# Patient Record
Sex: Female | Born: 2000 | Race: White | Hispanic: No | Smoking: Never smoker
Health system: Southern US, Community
[De-identification: ages and names within clinical notes are randomized; demographics above are authoritative.]

## PROBLEM LIST (undated history)

## (undated) DIAGNOSIS — R1110 Vomiting, unspecified: Secondary | ICD-10-CM

## (undated) DIAGNOSIS — R109 Unspecified abdominal pain: Secondary | ICD-10-CM

## (undated) DIAGNOSIS — F419 Anxiety disorder, unspecified: Secondary | ICD-10-CM

## (undated) DIAGNOSIS — Z789 Other specified health status: Secondary | ICD-10-CM

## (undated) DIAGNOSIS — R197 Diarrhea, unspecified: Secondary | ICD-10-CM

## (undated) HISTORY — DX: Anxiety disorder, unspecified: F41.9

## (undated) HISTORY — DX: Unspecified abdominal pain: R10.9

## (undated) HISTORY — DX: Diarrhea, unspecified: R19.7

## (undated) HISTORY — DX: Vomiting, unspecified: R11.10

## (undated) HISTORY — PX: NO PAST SURGERIES: SHX2092

---
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
## 2018-01-29 DIAGNOSIS — E282 Polycystic ovarian syndrome: Secondary | ICD-10-CM | POA: Insufficient documentation

## 2019-10-15 ENCOUNTER — Emergency Department: Payer: Medicaid Other

## 2019-10-15 ENCOUNTER — Other Ambulatory Visit: Payer: Self-pay

## 2019-10-15 ENCOUNTER — Emergency Department
Admission: EM | Admit: 2019-10-15 | Discharge: 2019-10-15 | Disposition: A | Discharge status: Home / Self Care | Payer: Medicaid Other | Attending: Emergency Medicine | Admitting: Emergency Medicine

## 2019-10-15 DIAGNOSIS — O219 Vomiting of pregnancy, unspecified: Secondary | ICD-10-CM | POA: Insufficient documentation

## 2019-10-15 DIAGNOSIS — Z3A01 Less than 8 weeks gestation of pregnancy: Secondary | ICD-10-CM | POA: Diagnosis not present

## 2019-10-15 DIAGNOSIS — R1013 Epigastric pain: Secondary | ICD-10-CM | POA: Diagnosis not present

## 2019-10-15 DIAGNOSIS — R112 Nausea with vomiting, unspecified: Secondary | ICD-10-CM

## 2019-10-15 DIAGNOSIS — Z79899 Other long term (current) drug therapy: Secondary | ICD-10-CM | POA: Diagnosis not present

## 2019-10-15 DIAGNOSIS — O26899 Other specified pregnancy related conditions, unspecified trimester: Secondary | ICD-10-CM

## 2019-10-15 LAB — COMPREHENSIVE METABOLIC PANEL
ALT: 17 U/L (ref 0–44)
AST: 21 U/L (ref 15–41)
Albumin: 5.2 g/dL — ABNORMAL HIGH (ref 3.5–5.0)
Alkaline Phosphatase: 49 U/L (ref 38–126)
Anion gap: 18 — ABNORMAL HIGH (ref 5–15)
BUN: 14 mg/dL (ref 6–20)
CO2: 18 mmol/L — ABNORMAL LOW (ref 22–32)
Calcium: 10 mg/dL (ref 8.9–10.3)
Chloride: 95 mmol/L — ABNORMAL LOW (ref 98–111)
Creatinine, Ser: 0.77 mg/dL (ref 0.44–1.00)
GFR calc Af Amer: >60 mL/min (ref >60)
GFR calc non Af Amer: >60 mL/min (ref >60)
Glucose, Bld: 171 mg/dL — ABNORMAL HIGH (ref 70–99)
Potassium: 3.4 mmol/L — ABNORMAL LOW (ref 3.5–5.1)
Sodium: 131 mmol/L — ABNORMAL LOW (ref 135–145)
Total Bilirubin: 1.5 mg/dL — ABNORMAL HIGH (ref 0.3–1.2)
Total Protein: 8.6 g/dL — ABNORMAL HIGH (ref 6.5–8.1)

## 2019-10-15 LAB — URINALYSIS, COMPLETE (UACMP) WITH MICROSCOPIC
Bilirubin Urine: NEGATIVE
Glucose, UA: 50 mg/dL — AB
Hgb urine dipstick: NEGATIVE
Ketones, ur: 80 mg/dL — AB
Leukocytes,Ua: NEGATIVE
Nitrite: NEGATIVE
Protein, ur: 100 mg/dL — AB
Specific Gravity, Urine: 1.029 (ref 1.005–1.030)
pH: 6 (ref 5.0–8.0)

## 2019-10-15 LAB — CBC
HCT: 41.1 % (ref 36.0–46.0)
Hemoglobin: 14.9 g/dL (ref 12.0–15.0)
MCH: 30.8 pg (ref 26.0–34.0)
MCHC: 36.3 g/dL — ABNORMAL HIGH (ref 30.0–36.0)
MCV: 85.1 fL (ref 80.0–100.0)
Platelets: 290 10*3/uL (ref 150–400)
RBC: 4.83 MIL/uL (ref 3.87–5.11)
RDW: 12.1 % (ref 11.5–15.5)
WBC: 16.5 10*3/uL — ABNORMAL HIGH (ref 4.0–10.5)
nRBC: 0 % (ref 0.0–0.2)

## 2019-10-15 LAB — HCG, QUANTITATIVE, PREGNANCY: hCG, Beta Chain, Quant, S: 65488 m[IU]/mL — ABNORMAL HIGH (ref <5)

## 2019-10-15 LAB — LIPASE, BLOOD: Lipase: 18 U/L (ref 11–51)

## 2019-10-15 LAB — POCT PREGNANCY, URINE: Preg Test, Ur: POSITIVE — AB

## 2019-10-15 MED ORDER — SODIUM CHLORIDE 0.9 % IV BOLUS
1000.0000 mL | Freq: Once | INTRAVENOUS | Status: AC
  Administered 2019-10-15: 1000 mL via INTRAVENOUS

## 2019-10-15 MED ORDER — SODIUM CHLORIDE 0.9 % IV BOLUS
1000.0000 mL | Freq: Once | INTRAVENOUS | Status: AC
  Administered 2019-10-15: 13:00:00 1000 mL via INTRAVENOUS

## 2019-10-15 MED ORDER — PROMETHAZINE HCL 12.5 MG PO TABS: 12.5000 mg | ORAL_TABLET | Freq: Four times a day (QID) | ORAL | 0 refills | Status: DC | PRN

## 2019-10-15 MED ORDER — PROMETHAZINE HCL 25 MG/ML IJ SOLN
12.5000 mg | Freq: Once | INTRAMUSCULAR | Status: AC
  Administered 2019-10-15: 14:00:00 12.5 mg via INTRAVENOUS
  Filled 2019-10-15: qty 1

## 2019-10-15 NOTE — ED Notes (Signed)
Lab called again to add on urinalysis.

## 2019-10-15 NOTE — ED Triage Notes (Signed)
Reports emesis when eating or drinking since Sunday. Pt found out she was pregnant Sunday. Pt alert and oriented X4, cooperative, RR even and unlabored, color WNL. Pt in NAD.

## 2019-10-15 NOTE — ED Notes (Signed)
No nausea of emesis currently.

## 2019-10-15 NOTE — ED Provider Notes (Signed)
[facility] Emergency Department Provider Note   ____________________________________________   First MD Initiated Contact with Patient 10/15/19 1344     (approximate)  I have reviewed the triage vital signs and the nursing notes.   HISTORY  Chief Complaint Emesis During Pregnancy    HPI Haley Robles is a 18 y.o. female who presents to the ED complaining of nausea and vomiting.  Patient reports she developed persistent nausea and vomiting about 5 days ago and has been unable to keep down any liquids or solids since then.  Emesis is nonbilious and nonbloody, is associated with nonbloody diarrhea as well.  She denies any fevers, cough, chest pain, or shortness of breath, but does complain of some upper abdominal pain.  She has not had any urinary symptoms and denies any vaginal bleeding or discharge.  She took a pregnancy test 4 days ago that was positive and states her last menstrual period was on September 9.  She has not yet established care with OB/GYN.        Past Medical History:  Diagnosis Date  . Abdominal pain, recurrent   . Diarrhea   . Vomiting     Patient Active Problem List   Diagnosis Date Noted  . Vomiting   . Diarrhea   . Generalized abdominal pain     History reviewed. No pertinent surgical history.  Prior to Admission medications   Medication Sig Start Date End Date Taking? Authorizing Provider  lansoprazole (PREVACID SOLUTAB) 15 MG disintegrating tablet Take 15 mg by mouth daily.      [provider]  promethazine (PHENERGAN) 12.5 MG tablet Take 1 tablet (12.5 mg total) by mouth every 6 (six) hours as needed for nausea or vomiting. 10/15/19   Blake Divine, MD    Allergies Patient has no known allergies.  Family History  Problem Relation Age of Onset  . Pancreatitis Father   . Cholelithiasis Father   . Ulcers Father   . Ulcers Maternal Grandmother   . Cholelithiasis Maternal Grandfather   . Crohn's disease  Cousin     Social History Social History   Tobacco Use  . Smoking status: Never Smoker  . Smokeless tobacco: Never Used  Substance Use Topics  . Alcohol use: Not on file  . Drug use: Not on file    Review of Systems  Constitutional: No fever/chills Eyes: No visual changes. ENT: No sore throat. Cardiovascular: Denies chest pain. Respiratory: Denies shortness of breath. Gastrointestinal: Positive for abdominal pain, nausea, vomiting, and diarrhea.  No constipation. Genitourinary: Negative for dysuria. Musculoskeletal: Negative for back pain. Skin: Negative for rash. Neurological: Negative for headaches, focal weakness or numbness.  ____________________________________________   PHYSICAL EXAM:  VITAL SIGNS: ED Triage Vitals [10/15/19 1224]  Enc Vitals Group     BP 113/61     Pulse Rate (!) 138     Resp 16     Temp 98.7 F (37.1 C)     Temp Source Oral     SpO2 100 %     Weight 118 lb (53.5 kg)     Height 5\' 4"  (1.626 m)     Head Circumference      Peak Flow      Pain Score 3     Pain Loc      Pain Edu?      Excl. in [place]?     Constitutional: Alert and oriented. Eyes: Conjunctivae are normal. Head: Atraumatic. Nose: No congestion/rhinnorhea. Mouth/Throat: Mucous membranes are  moist. Neck: Normal ROM Cardiovascular: Normal rate, regular rhythm. Grossly normal heart sounds. Respiratory: Normal respiratory effort.  No retractions. Lungs CTAB. Gastrointestinal: Soft and tender to palpation in epigastrium and right upper quadrant with no rebound or guarding. No distention. Genitourinary: deferred Musculoskeletal: No lower extremity tenderness nor edema. Neurologic:  Normal speech and language. No gross focal neurologic deficits are appreciated. Skin:  Skin is warm, dry and intact. No rash noted. Psychiatric: Mood and affect are normal. Speech and behavior are normal.  ____________________________________________   LABS (all labs ordered are listed, but only  abnormal results are displayed)  Labs Reviewed  COMPREHENSIVE METABOLIC PANEL - Abnormal; Notable for the following components:      Result Value   Sodium 131 (*)    Potassium 3.4 (*)    Chloride 95 (*)    CO2 18 (*)    Glucose, Bld 171 (*)    Total Protein 8.6 (*)    Albumin 5.2 (*)    Total Bilirubin 1.5 (*)    Anion gap 18 (*)    All other components within normal limits  CBC - Abnormal; Notable for the following components:   WBC 16.5 (*)    MCHC 36.3 (*)    All other components within normal limits  URINALYSIS, COMPLETE (UACMP) WITH MICROSCOPIC - Abnormal; Notable for the following components:   Color, Urine YELLOW (*)    APPearance CLOUDY (*)    Glucose, UA 50 (*)    Ketones, ur 80 (*)    Protein, ur 100 (*)    Bacteria, UA RARE (*)    All other components within normal limits  HCG, QUANTITATIVE, PREGNANCY - Abnormal; Notable for the following components:   hCG, Beta Chain, Quant, S [id number] (*)    All other components within normal limits  POCT PREGNANCY, URINE - Abnormal; Notable for the following components:   Preg Test, Ur POSITIVE (*)    All other components within normal limits  LIPASE, BLOOD  POC URINE PREG, ED     PROCEDURES  Procedure(s) performed (including Critical Care):  Procedures   ____________________________________________   INITIAL IMPRESSION / ASSESSMENT AND PLAN / ED COURSE       18 year old female with recent positive pregnancy test presents to the ED with persistent nausea and vomiting, has now developed some abdominal pain with epigastric tenderness.  Labs are significant for mild hyponatremia and hypokalemia, will give fluid bolus and treat nausea with Phenergan.  Given her abdominal pain, will screen ultrasound to assess for ectopic pregnancy as well as biliary pathology.  She has a very mildly elevated bilirubin but lipase is within normal limits and remainder of LFTs are unremarkable.  Right upper quadrant ultrasound is negative,  no evidence of gallstones or other biliary pathology.  Pelvic ultrasound shows intrauterine pregnancy at approximately 6 weeks and 2 days.  UA with no evidence of infection.  Patient feeling much better following dose of Phenergan, has been able to tolerate water here without any vomiting.  Will prescribe Phenergan for home use, counseled patient to follow-up with her OB/GYN and return to the ED for new worsening symptoms.  Patient agrees with plan.      ____________________________________________   FINAL CLINICAL IMPRESSION(S) / ED DIAGNOSES  Final diagnoses:  Nausea and vomiting  Vomiting during pregnancy     ED Discharge Orders         Ordered    promethazine (PHENERGAN) 12.5 MG tablet  Every 6 hours PRN     10/15/19  1731           Note:  This document was prepared using Dragon voice recognition software and may include unintentional dictation errors.   Chesley Noon, MD 10/15/19 2135

## 2019-10-15 NOTE — ED Notes (Signed)
Lab called to add on urinalysis. 

## 2019-10-20 ENCOUNTER — Ambulatory Visit (INDEPENDENT_AMBULATORY_CARE_PROVIDER_SITE_OTHER): Payer: Medicaid Other | Admitting: Certified Nurse Midwife

## 2019-10-20 ENCOUNTER — Encounter: Payer: Self-pay | Admitting: Certified Nurse Midwife

## 2019-10-20 ENCOUNTER — Other Ambulatory Visit: Payer: Self-pay

## 2019-10-20 VITALS — BP 106/72 | HR 99 | Ht 64.0 in | Wt 103.6 lb

## 2019-10-20 DIAGNOSIS — Z3201 Encounter for pregnancy test, result positive: Secondary | ICD-10-CM

## 2019-10-20 DIAGNOSIS — N926 Irregular menstruation, unspecified: Secondary | ICD-10-CM | POA: Diagnosis not present

## 2019-10-20 LAB — POCT URINE PREGNANCY: Preg Test, Ur: POSITIVE — AB

## 2019-10-20 MED ORDER — BONJESTA 20-20 MG PO TBCR: 1.0000 | EXTENDED_RELEASE_TABLET | Freq: Two times a day (BID) | ORAL | 4 refills | Status: DC

## 2019-10-20 NOTE — Progress Notes (Signed)
Subjective:    Haley Robles is a 18 y.o. female who presents for evaluation of amenorrhea. She believes she could be pregnant. Pregnancy is desired. Sexual Activity: single partner, contraception: none. Current symptoms also include: nausea. Last period was normal.   Patient's last menstrual period was 08/26/2019 (exact date). The following portions of the patient's history were reviewed and updated as appropriate: allergies, current medications, past family history, past medical history, past social history, past surgical history and problem list.  Review of Systems Pertinent items are noted in HPI.     Objective:    BP 106/72   Pulse 99   Ht 5\' 4"  (1.626 m)   Wt 103 lb 9 oz (47 kg)   LMP 08/26/2019 (Exact Date)   BMI 17.78 kg/m  General: alert, cooperative, appears stated age, no distress and no acute distress    Lab Review Urine HCG: positive    Assessment:    Absence of menstruation.     Plan:   Positive: EDC: 06/01/20 Briefly discussed pre-natal care options. MD or Midwifery care discussed.  Encouraged well-balanced diet, plenty of rest when needed, pre-natal vitamins daily and walking for exercise. Discussed self-help for nausea, avoiding OTC medications until consulting provider or pharmacist, other than Tylenol as needed, minimal caffeine (1-2 cups daily) and avoiding alcohol. She will schedule her dating u/s 1 wk, her nurse visit @ 12 wks and her initial NOB physical @ 12 wks.  Feel free to call with any questions.  Philip Aspen, CNM

## 2019-10-20 NOTE — Patient Instructions (Signed)

## 2019-10-27 ENCOUNTER — Other Ambulatory Visit: Payer: Self-pay

## 2019-10-27 ENCOUNTER — Ambulatory Visit (INDEPENDENT_AMBULATORY_CARE_PROVIDER_SITE_OTHER): Payer: Medicaid Other

## 2019-10-27 DIAGNOSIS — Z3A08 8 weeks gestation of pregnancy: Secondary | ICD-10-CM

## 2019-10-27 DIAGNOSIS — N926 Irregular menstruation, unspecified: Secondary | ICD-10-CM

## 2019-10-27 DIAGNOSIS — Z3687 Encounter for antenatal screening for uncertain dates: Secondary | ICD-10-CM

## 2019-11-06 ENCOUNTER — Ambulatory Visit (INDEPENDENT_AMBULATORY_CARE_PROVIDER_SITE_OTHER): Payer: Self-pay | Admitting: Certified Nurse Midwife

## 2019-11-06 ENCOUNTER — Other Ambulatory Visit: Payer: Self-pay

## 2019-11-06 VITALS — BP 106/72 | HR 99 | Ht 64.0 in | Wt 104.9 lb

## 2019-11-06 DIAGNOSIS — Z3491 Encounter for supervision of normal pregnancy, unspecified, first trimester: Secondary | ICD-10-CM

## 2019-11-06 NOTE — Progress Notes (Signed)
Haley Robles presents for NOB nurse interview visit. Pregnancy confirmation done _11/02/2019. G-1 P0. Pregnancy education material explained and given. 0 cats in home. NOB labs ordered.  HIV labs and drug screen were explained and ordered. PNV encouraged. Genetic screening options discussed. Genetic testing: Ordered/Declined/Unsure. Patient may discuss with the provider. Patient to follow up with provider in 11/24/2019 weeks for NOB physical. All questions answered. Ready set baby has been gone over. FMLA paper work Visual merchandiser.

## 2019-11-07 ENCOUNTER — Other Ambulatory Visit: Payer: Self-pay | Admitting: Certified Nurse Midwife

## 2019-11-07 LAB — URINALYSIS, ROUTINE W REFLEX MICROSCOPIC
Bilirubin, UA: NEGATIVE
Glucose, UA: NEGATIVE
Nitrite, UA: POSITIVE — AB
RBC, UA: NEGATIVE
Specific Gravity, UA: 1.025 (ref 1.005–1.030)
Urobilinogen, Ur: 0.2 mg/dL (ref 0.2–1.0)
pH, UA: 7 (ref 5.0–7.5)

## 2019-11-07 LAB — MICROSCOPIC EXAMINATION: Casts: NONE SEEN /lpf

## 2019-11-07 MED ORDER — NITROFURANTOIN MONOHYD MACRO 100 MG PO CAPS: 100.0000 mg | ORAL_CAPSULE | Freq: Two times a day (BID) | ORAL | 0 refills | Status: AC

## 2019-11-07 NOTE — Progress Notes (Signed)
Urine dip positive for nitrates. Orders placed for treatment.  Philip Aspen, CNM

## 2019-11-09 LAB — VARICELLA ZOSTER ANTIBODY, IGG: Varicella zoster IgG: <135 index — ABNORMAL LOW (ref >165)

## 2019-11-09 LAB — RUBELLA SCREEN: Rubella Antibodies, IGG: 1.82 index (ref >0.99)

## 2019-11-09 LAB — URINE CULTURE, OB REFLEX

## 2019-11-09 LAB — ANTIBODY SCREEN: Antibody Screen: NEGATIVE

## 2019-11-09 LAB — RPR: RPR Ser Ql: NONREACTIVE

## 2019-11-09 LAB — HIV ANTIBODY (ROUTINE TESTING W REFLEX): HIV Screen 4th Generation wRfx: NONREACTIVE

## 2019-11-09 LAB — ABO AND RH: Rh Factor: POSITIVE

## 2019-11-09 LAB — HEPATITIS B SURFACE ANTIGEN: Hepatitis B Surface Ag: NEGATIVE

## 2019-11-09 LAB — CULTURE, OB URINE

## 2019-11-10 LAB — MONITOR DRUG PROFILE 14(MW)
Amphetamine Scrn, Ur: NEGATIVE ng/mL
BARBITURATE SCREEN URINE: NEGATIVE ng/mL
BENZODIAZEPINE SCREEN, URINE: NEGATIVE ng/mL
Buprenorphine, Urine: NEGATIVE ng/mL
Cocaine (Metab) Scrn, Ur: NEGATIVE ng/mL
Creatinine(Crt), U: 168.6 mg/dL (ref 20.0–300.0)
Fentanyl, Urine: NEGATIVE pg/mL
Meperidine Screen, Urine: NEGATIVE ng/mL
Methadone Screen, Urine: NEGATIVE ng/mL
OXYCODONE+OXYMORPHONE UR QL SCN: NEGATIVE ng/mL
Opiate Scrn, Ur: NEGATIVE ng/mL
Ph of Urine: 7.4 (ref 4.5–8.9)
Phencyclidine Qn, Ur: NEGATIVE ng/mL
Propoxyphene Scrn, Ur: NEGATIVE ng/mL
SPECIFIC GRAVITY: 1.029
Tramadol Screen, Urine: NEGATIVE ng/mL

## 2019-11-10 LAB — NICOTINE SCREEN, URINE: Cotinine Ql Scrn, Ur: NEGATIVE ng/mL

## 2019-11-10 LAB — CANNABINOID (GC/MS), URINE
Cannabinoid: POSITIVE — AB
Carboxy THC (GC/MS): 204 ng/mL

## 2019-11-11 LAB — GC/CHLAMYDIA PROBE AMP
Chlamydia trachomatis, NAA: NEGATIVE
Neisseria Gonorrhoeae by PCR: NEGATIVE

## 2019-11-24 ENCOUNTER — Encounter: Payer: Self-pay | Admitting: Certified Nurse Midwife

## 2019-11-24 ENCOUNTER — Ambulatory Visit (INDEPENDENT_AMBULATORY_CARE_PROVIDER_SITE_OTHER): Payer: Medicaid Other | Admitting: Certified Nurse Midwife

## 2019-11-24 ENCOUNTER — Other Ambulatory Visit: Payer: Self-pay

## 2019-11-24 VITALS — BP 97/66 | HR 118 | Wt 105.3 lb

## 2019-11-24 DIAGNOSIS — Z3491 Encounter for supervision of normal pregnancy, unspecified, first trimester: Secondary | ICD-10-CM

## 2019-11-24 DIAGNOSIS — R636 Underweight: Secondary | ICD-10-CM

## 2019-11-24 DIAGNOSIS — N390 Urinary tract infection, site not specified: Secondary | ICD-10-CM

## 2019-11-24 DIAGNOSIS — O99711 Diseases of the skin and subcutaneous tissue complicating pregnancy, first trimester: Secondary | ICD-10-CM

## 2019-11-24 NOTE — Progress Notes (Signed)
NEW OB HISTORY AND PHYSICAL  SUBJECTIVE:       Haley Robles is a 18 y.o. G1P0 female, Patient's last menstrual period was 08/26/2019., Estimated Date of Delivery: 06/07/20, [redacted]w[redacted]d, presents today for establishment of Prenatal Care. She has no unusual complaints and complains of fatigue and feeling hot.   Body mass index is 18.08 kg/m.     Gynecologic History Patient's last menstrual period was 08/26/2019. Normal Contraception: none Last Pap: n/a   Obstetric History OB History  Gravida Para Term Preterm AB Living  1            SAB TAB Ectopic Multiple Live Births               # Outcome Date GA Lbr Len/2nd Weight Sex Delivery Anes PTL Lv  1 Current             Past Medical History:  Diagnosis Date  . Abdominal pain, recurrent   . Anxiety   . Diarrhea   . Vomiting     No past surgical history on file.  Current Outpatient Medications on File Prior to Visit  Medication Sig Dispense Refill  . Prenatal Vit-Fe Fumarate-FA (PRENATAL MULTIVITAMIN) TABS tablet Take 1 tablet by mouth daily at 12 noon.    . Doxylamine-Pyridoxine ER (BONJESTA) 20-20 MG TBCR Take 1 capsule by mouth 2 (two) times daily. (Patient not taking: Reported on 11/24/2019) 62 tablet 4  . lansoprazole (PREVACID SOLUTAB) 15 MG disintegrating tablet Take 15 mg by mouth daily.      . promethazine (PHENERGAN) 12.5 MG tablet Take 1 tablet (12.5 mg total) by mouth every 6 (six) hours as needed for nausea or vomiting. (Patient not taking: Reported on 11/24/2019) 15 tablet 0   No current facility-administered medications on file prior to visit.     No Known Allergies  Social History   Socioeconomic History  . Marital status: Single    Spouse name: Not on file  . Number of children: Not on file  . Years of education: Not on file  . Highest education level: Not on file  Occupational History  . Not on file  Social Needs  . Financial resource strain: Not on file  . Food insecurity    Worry: Not on file   Inability: Not on file  . Transportation needs    Medical: Not on file    Non-medical: Not on file  Tobacco Use  . Smoking status: Never Smoker  . Smokeless tobacco: Never Used  Substance and Sexual Activity  . Alcohol use: Not Currently  . Drug use: Not Currently  . Sexual activity: Yes    Birth control/protection: None  Lifestyle  . Physical activity    Days per week: Not on file    Minutes per session: Not on file  . Stress: Not on file  Relationships  . Social Musician on phone: Not on file    Gets together: Not on file    Attends religious service: Not on file    Active member of club or organization: Not on file    Attends meetings of clubs or organizations: Not on file    Relationship status: Not on file  . Intimate partner violence    Fear of current or ex partner: Not on file    Emotionally abused: Not on file    Physically abused: Not on file    Forced sexual activity: Not on file  Other Topics Concern  . Not  on file  Social History Narrative   6th grade    Family History  Problem Relation Age of Onset  . Pancreatitis Father   . Cholelithiasis Father   . Ulcers Father   . Ulcers Maternal Grandmother   . Cholelithiasis Maternal Grandfather   . Crohn's disease Cousin     The following portions of the patient's history were reviewed and updated as appropriate: allergies, current medications, past OB history, past medical history, past surgical history, past family history, past social history, and problem list.    OBJECTIVE: Initial Physical Exam (New OB)  GENERAL APPEARANCE: alert, well appearing, in no apparent distress, oriented to person, place and time, underweight HEAD: normocephalic, atraumatic MOUTH: mucous membranes moist, pharynx normal without lesions THYROID: no thyromegaly or masses present BREASTS: no masses noted, no significant tenderness, no palpable axillary nodes, no skin changes, bilateral nipple piercing's  LUNGS: clear  to auscultation, no wheezes, rales or rhonchi, symmetric air entry HEART: regular rate and rhythm, no murmurs ABDOMEN: soft, nontender, nondistended, no abnormal masses, no epigastric pain EXTREMITIES: no redness or tenderness in the calves or thighs, no edema, no limitation in range of motion, intact peripheral pulses SKIN: normal coloration and turgor, no rashes LYMPH NODES: no adenopathy palpable NEUROLOGIC: alert, oriented, normal speech, no focal findings or movement disorder noted  PELVIC EXAM EXTERNAL GENITALIA: normal appearing vulva with no masses, tenderness or lesions VAGINA: no abnormal discharge or lesions CERVIX: no lesions or cervical motion tenderness UTERUS: gravid ADNEXA: no masses palpable and nontender OB EXAM PELVIMETRY: appears adequate RECTUM: exam not indicated  ASSESSMENT: Normal pregnancy  PLAN: New OB counseling: The patient has been given an overview regarding routine prenatal care. Recommendations regarding diet, weight gain, and exercise in pregnancy were given. Prenatal testing, optional genetic testing,carrier screening testing, and ultrasound use in pregnancy were reviewed. Referral placed for nutritionist due to BMI.9underweight). Refferral placed to dermatology for acne in pregnancy. Benefits of Breast Feeding were discussed. The patient is encouraged to consider nursing her baby post partum. Follow up 4 wk.   Philip Aspen, CNM

## 2019-11-24 NOTE — Addendum Note (Signed)
Addended by: Raliegh Ip on: 11/24/2019 04:02 PM   Modules accepted: Orders

## 2019-11-24 NOTE — Patient Instructions (Signed)

## 2019-11-25 LAB — CBC
Hematocrit: 36.7 % (ref 34.0–46.6)
Hemoglobin: 12.7 g/dL (ref 11.1–15.9)
MCH: 31.2 pg (ref 26.6–33.0)
MCHC: 34.6 g/dL (ref 31.5–35.7)
MCV: 90 fL (ref 79–97)
Platelets: 248 10*3/uL (ref 150–450)
RBC: 4.07 x10E6/uL (ref 3.77–5.28)
RDW: 12.3 % (ref 11.7–15.4)
WBC: 11.3 10*3/uL — ABNORMAL HIGH (ref 3.4–10.8)

## 2019-11-26 LAB — URINE CULTURE

## 2019-12-07 NOTE — Telephone Encounter (Signed)
Pt called to find out the gender of baby. Told her the nurse was on lunch & I'd have you call once you return.

## 2019-12-16 ENCOUNTER — Other Ambulatory Visit: Payer: Self-pay

## 2019-12-16 ENCOUNTER — Encounter: Payer: Medicaid Other | Attending: Certified Nurse Midwife | Admitting: Skilled Nursing Facility1

## 2019-12-16 ENCOUNTER — Encounter: Payer: Self-pay | Admitting: Skilled Nursing Facility1

## 2019-12-16 DIAGNOSIS — O26899 Other specified pregnancy related conditions, unspecified trimester: Secondary | ICD-10-CM | POA: Insufficient documentation

## 2019-12-16 DIAGNOSIS — Z3A00 Weeks of gestation of pregnancy not specified: Secondary | ICD-10-CM | POA: Insufficient documentation

## 2019-12-16 DIAGNOSIS — R636 Underweight: Secondary | ICD-10-CM | POA: Insufficient documentation

## 2019-12-16 DIAGNOSIS — O2611 Low weight gain in pregnancy, first trimester: Secondary | ICD-10-CM

## 2019-12-16 DIAGNOSIS — O2612 Low weight gain in pregnancy, second trimester: Secondary | ICD-10-CM

## 2019-12-16 DIAGNOSIS — Z713 Dietary counseling and surveillance: Secondary | ICD-10-CM | POA: Diagnosis present

## 2019-12-16 NOTE — Progress Notes (Signed)
  Assessment:  Primary concerns today: underweight in preganancy.   Pt states she has always had trouble gaining weight her whole life. Pt states she was 105.5 pounds at her last doctor appt. Pt states once she got her [facility] card she went grocery shopping. Pt states this is her first pregnacy and she is about [redacted] weeks along. Pt states her weight flucuated a lot and states she was usually 110 pounds (down to 107). Pt states when she was first pregnant she has hyperemesis and diarrhea getting her down to 103 pounds. Pt states her mom told her she was about 110 pounds when she was pregnant and her aunt was thin as well. Pt states she is lazy and always has been stating she does not have much energy. Pt states most of her life she has been tired able to just fall asleep easily. Pt states she would typically eat once a day so being pregnant having to eat throughout the day has been challenging. Pt states she does not have an emotional reason for not eating just no desire. Pt states she has  sensitive stomach for foods getting sick easily. Pt states she missed a lot of days to school due to getting sick due to anxiety going to school. Pt states she has pretty bad anxiety. Pt states she does desire to gain weight. Pt states in the last few days she has had more enregy than she ever has in the past. Pt states she recently learned eating fast food is not healthy so she eats more from home. Pt sates last week in the morning she woke up vomiting and diarrhea.  Pt states she has a great support system with her mother and fiancee.   MEDICATIONS: none   DIETARY INTAKE:  24 hr recall:  Breakfast: Cereal or pancakes with sausage and eggs with fruit Fruit Naked drink Cheese and crackers and grapes chicken biscuit with cheese and honey  Fruit and some noodles   Beverages: water, milk (every other day), naked drinks   Usual physical activity: ADL's  Intervention:  Nutrition coinseling. Dietitian educated pt on  her anxiety and that connection to no appetite and little energy as well as that impact on pregnancy weight/fetus growth/develpoment.  Goals: Drink one glass of whole milk every day (7 days) Eat greek yogurt  When eating cereal try nuts with it or eggs on the side Aim to eat every 3 hours  On days you are feeling anxious do not go longer than 6 hours without eating  Change what category of food you are snacking on throughout the day: switch up between vegetables, fruits, and proteins  Aim to limit eating out to once a week Take your prenatal every day; try natures made prenatal multi Do not take your prenatal until after dinner  Teaching Method Utilized: Visual Auditory Hands on  Handouts given during visit include:  myplate  Barriers to learning/adherence to lifestyle change: none identified  Demonstrated degree of understanding via:  Teach Back   Monitoring/Evaluation:  Dietary intake, exercise, and weight

## 2019-12-18 NOTE — L&D Delivery Note (Signed)
Delivery Note  Called by RN for impending birth at 33, SVE: 10/100/+3.   0905 In room to see patient, fetal vertex visible on perineum.   Spontaneous vaginal birth of liveborn female infant in left occiput anterior position over intact perineum at 0909. Infant immediately to maternal abdomen. Delayed cord clamping, skin to skin, three (3) vessel cord and single tub of cord blood collected. APGARs: 9, 10. Receiving nurse and Neonatologist present at bedside for birth.   IM pitocin given. Large gush of blood noted. Spontaneous delivery of intact placenta at 0912. Asymmetric bilateral labial lacerations hemostatic, unrepaired. Uterus firm. Rubra small. Anesthesia none. Vault check completed. Counts correct x 2. QBL pending.   Initiate routine postpartum care and orders.  Mom to postpartum.  Baby to Couplet care / Skin to Skin.  FOB present at bedside and overjoyed with the birth of "Haley Robles".    Gunnar Bulla, CNM [facility], [facility] 05/12/2020, 10:04 AM

## 2019-12-28 ENCOUNTER — Encounter: Payer: Medicaid Other | Admitting: Certified Nurse Midwife

## 2020-01-04 ENCOUNTER — Other Ambulatory Visit: Payer: Self-pay

## 2020-01-04 ENCOUNTER — Ambulatory Visit (INDEPENDENT_AMBULATORY_CARE_PROVIDER_SITE_OTHER): Payer: Medicaid Other | Admitting: Certified Nurse Midwife

## 2020-01-04 VITALS — BP 104/59 | HR 97 | Wt 120.2 lb

## 2020-01-04 DIAGNOSIS — Z3402 Encounter for supervision of normal first pregnancy, second trimester: Secondary | ICD-10-CM

## 2020-01-04 LAB — POCT URINALYSIS DIPSTICK OB
Bilirubin, UA: NEGATIVE
Blood, UA: NEGATIVE
Glucose, UA: NEGATIVE
Ketones, UA: NEGATIVE
Leukocytes, UA: NEGATIVE
Nitrite, UA: NEGATIVE
Spec Grav, UA: 1.01 (ref 1.010–1.025)
Urobilinogen, UA: 0.2 E.U./dL
pH, UA: 7.5 (ref 5.0–8.0)

## 2020-01-04 NOTE — Patient Instructions (Signed)

## 2020-01-04 NOTE — Progress Notes (Signed)
ROB doing well. Feels fluttering. Pt saw dietician - gained weight this visit.  Discussed round ligament pain , and self help measures. Reassurance given. Reviewed anatomy scan next visit. Follow up 3 wks.   Doreene Burke, CNM

## 2020-01-26 ENCOUNTER — Other Ambulatory Visit: Payer: Self-pay

## 2020-01-26 ENCOUNTER — Ambulatory Visit (INDEPENDENT_AMBULATORY_CARE_PROVIDER_SITE_OTHER): Payer: Medicaid Other | Admitting: Certified Nurse Midwife

## 2020-01-26 ENCOUNTER — Other Ambulatory Visit: Payer: Medicaid Other

## 2020-01-26 VITALS — BP 97/65 | HR 94 | Wt 122.3 lb

## 2020-01-26 DIAGNOSIS — Z3402 Encounter for supervision of normal first pregnancy, second trimester: Secondary | ICD-10-CM

## 2020-01-26 NOTE — Progress Notes (Signed)
ROB doing well. Feels occasional fluttering. U/s today canceled due to tech being out. Rescheduled for Thursday. Follow up  Thursday for u/s and ROB in 4 wks with Loletta Specter, CNM

## 2020-01-26 NOTE — Patient Instructions (Signed)

## 2020-01-27 NOTE — Lactation Note (Signed)
LATE ENTRY  Lactation Consultation Note  Patient Name: Haley Robles Date: 01/27/2020     Maternal Data    Feeding    LATCH Score                   Interventions    Lactation Tools Discussed/Used     Consult Status   On 01/26/20 Lactation student discussed benefits of breastfeeding per the Ready, Set, Baby curriculum. Hardie Pulley encouraged to review breastfeeding information on Ready, set, [employer] site and given information for virtual breastfeeding classes.     Arlyss Gandy 01/27/2020, 4:51 PM

## 2020-01-28 ENCOUNTER — Other Ambulatory Visit: Payer: Self-pay

## 2020-01-28 ENCOUNTER — Ambulatory Visit (INDEPENDENT_AMBULATORY_CARE_PROVIDER_SITE_OTHER): Payer: Medicaid Other

## 2020-01-28 DIAGNOSIS — Z3402 Encounter for supervision of normal first pregnancy, second trimester: Secondary | ICD-10-CM

## 2020-02-18 ENCOUNTER — Other Ambulatory Visit: Payer: Self-pay

## 2020-02-18 ENCOUNTER — Encounter: Payer: Self-pay | Admitting: Obstetrics and Gynecology

## 2020-02-18 ENCOUNTER — Observation Stay
Admission: EM | Admit: 2020-02-18 | Discharge: 2020-02-18 | Disposition: A | Discharge status: Home / Self Care | Payer: Medicaid Other | Attending: Certified Nurse Midwife | Admitting: Certified Nurse Midwife

## 2020-02-18 ENCOUNTER — Telehealth: Payer: Self-pay

## 2020-02-18 ENCOUNTER — Telehealth: Payer: Self-pay | Admitting: Certified Nurse Midwife

## 2020-02-18 ENCOUNTER — Observation Stay: Payer: Medicaid Other

## 2020-02-18 DIAGNOSIS — Z20822 Contact with and (suspected) exposure to covid-19: Secondary | ICD-10-CM | POA: Diagnosis not present

## 2020-02-18 DIAGNOSIS — O26899 Other specified pregnancy related conditions, unspecified trimester: Secondary | ICD-10-CM | POA: Diagnosis present

## 2020-02-18 DIAGNOSIS — Z3A24 24 weeks gestation of pregnancy: Secondary | ICD-10-CM | POA: Insufficient documentation

## 2020-02-18 DIAGNOSIS — O99891 Other specified diseases and conditions complicating pregnancy: Secondary | ICD-10-CM | POA: Diagnosis not present

## 2020-02-18 DIAGNOSIS — R1031 Right lower quadrant pain: Secondary | ICD-10-CM

## 2020-02-18 DIAGNOSIS — O2342 Unspecified infection of urinary tract in pregnancy, second trimester: Principal | ICD-10-CM | POA: Insufficient documentation

## 2020-02-18 DIAGNOSIS — R1114 Bilious vomiting: Secondary | ICD-10-CM | POA: Diagnosis not present

## 2020-02-18 DIAGNOSIS — R112 Nausea with vomiting, unspecified: Secondary | ICD-10-CM | POA: Diagnosis present

## 2020-02-18 LAB — URINALYSIS, COMPLETE (UACMP) WITH MICROSCOPIC
Bilirubin Urine: NEGATIVE
Glucose, UA: NEGATIVE mg/dL
Ketones, ur: 20 mg/dL — AB
Nitrite: NEGATIVE
Protein, ur: 100 mg/dL — AB
RBC / HPF: >50 RBC/hpf — ABNORMAL HIGH (ref 0–5)
Specific Gravity, Urine: 1.014 (ref 1.005–1.030)
WBC, UA: >50 WBC/hpf — ABNORMAL HIGH (ref 0–5)
pH: 6 (ref 5.0–8.0)

## 2020-02-18 LAB — RESPIRATORY PANEL BY RT PCR (FLU A&B, COVID)
Influenza A by PCR: NEGATIVE
Influenza B by PCR: NEGATIVE
SARS Coronavirus 2 by RT PCR: NEGATIVE

## 2020-02-18 MED ORDER — LOPERAMIDE HCL 2 MG PO CAPS: 4.0000 mg | ORAL_CAPSULE | ORAL | 0 refills | Status: DC | PRN

## 2020-02-18 MED ORDER — MORPHINE SULFATE (PF) 4 MG/ML IV SOLN
INTRAVENOUS | Status: AC
  Administered 2020-02-18: 12:00:00 4 mg via INTRAVENOUS
  Filled 2020-02-18: qty 1

## 2020-02-18 MED ORDER — NITROFURANTOIN MONOHYD MACRO 100 MG PO CAPS: 100.0000 mg | ORAL_CAPSULE | Freq: Two times a day (BID) | ORAL | Status: DC

## 2020-02-18 MED ORDER — NITROFURANTOIN MONOHYD MACRO 100 MG PO CAPS: 100.0000 mg | ORAL_CAPSULE | Freq: Two times a day (BID) | ORAL | 0 refills | Status: DC

## 2020-02-18 MED ORDER — ONDANSETRON 4 MG PO TBDP: 4.0000 mg | ORAL_TABLET | Freq: Four times a day (QID) | ORAL | 0 refills | Status: DC | PRN

## 2020-02-18 MED ORDER — LACTATED RINGERS IV BOLUS
1000.0000 mL | Freq: Once | INTRAVENOUS | Status: AC
  Administered 2020-02-18: 1000 mL via INTRAVENOUS

## 2020-02-18 MED ORDER — DEXTROSE IN LACTATED RINGERS 5 % IV SOLN: INTRAVENOUS | Status: DC

## 2020-02-18 MED ORDER — MORPHINE SULFATE (PF) 4 MG/ML IV SOLN: 4.0000 mg | INTRAVENOUS | Status: DC | PRN

## 2020-02-18 MED ORDER — ONDANSETRON HCL 4 MG/2ML IJ SOLN: 4.0000 mg | Freq: Four times a day (QID) | INTRAMUSCULAR | Status: DC | PRN

## 2020-02-18 MED ORDER — ONDANSETRON HCL 4 MG/2ML IJ SOLN
INTRAMUSCULAR | Status: AC
  Administered 2020-02-18: 4 mg via INTRAVENOUS
  Filled 2020-02-18: qty 2

## 2020-02-18 MED ORDER — NITROFURANTOIN MONOHYD MACRO 100 MG PO CAPS
ORAL_CAPSULE | ORAL | Status: AC
  Administered 2020-02-18: 100 mg
  Filled 2020-02-18: qty 1

## 2020-02-18 MED ORDER — LOPERAMIDE HCL 2 MG PO CAPS
4.0000 mg | ORAL_CAPSULE | ORAL | Status: DC | PRN
  Filled 2020-02-18: qty 2

## 2020-02-18 NOTE — Discharge Instructions (Signed)
Abdominal Pain During Pregnancy  Belly (abdominal) pain is common during pregnancy. There are many possible causes. Most of the time, it is not a serious problem. Other times, it can be a sign that something is wrong with the pregnancy. Always tell your doctor if you have belly pain. Follow these instructions at home:  Do not have sex or put anything in your vagina until your pain goes away completely.  Get plenty of rest until your pain gets better.  Drink enough fluid to keep your pee (urine) pale yellow.  Take over-the-counter and prescription medicines only as told by your doctor.  Keep all follow-up visits as told by your doctor. This is important. Contact a doctor if:  Your pain continues or gets worse after resting.  You have lower belly pain that: ? Comes and goes at regular times. ? Spreads to your back. ? Feels like menstrual cramps.  You have pain or burning when you pee (urinate). Get help right away if:  You have a fever or chills.  You have vaginal bleeding.  You are leaking fluid from your vagina.  You are passing tissue from your vagina.  You throw up (vomit) for more than 24 hours.  You have watery poop (diarrhea) for more than 24 hours.  Your baby is moving less than usual.  You feel very weak or faint.  You have shortness of breath.  You have very bad pain in your upper belly. Summary  Belly (abdominal) pain is common during pregnancy. There are many possible causes.  If you have belly pain during pregnancy, tell your doctor right away.  Keep all follow-up visits as told by your doctor. This is important. This information is not intended to replace advice given to you by your health care provider. Make sure you discuss any questions you have with your health care provider. Document Revised: 03/23/2019 Document Reviewed: 03/07/2017 Elsevier Patient Education  2020 Elsevier Inc.   Round Ligament Pain  The round ligament is a cord of muscle  and tissue that helps support the uterus. It can become a source of pain during pregnancy if it becomes stretched or twisted as the baby grows. The pain usually begins in the second trimester (13-28 weeks) of pregnancy, and it can come and go until the baby is delivered. It is not a serious problem, and it does not cause harm to the baby. Round ligament pain is usually a short, sharp, and pinching pain, but it can also be a dull, lingering, and aching pain. The pain is felt in the lower side of the abdomen or in the groin. It usually starts deep in the groin and moves up to the outside of the hip area. The pain may occur when you:  Suddenly change position, such as quickly going from a sitting to standing position.  Roll over in bed.  Cough or sneeze.  Do physical activity. Follow these instructions at home:   Watch your condition for any changes.  When the pain starts, relax. Then try any of these methods to help with the pain: ? Sitting down. ? Flexing your knees up to your abdomen. ? Lying on your side with one pillow under your abdomen and another pillow between your legs. ? Sitting in a warm bath for 15-20 minutes or until the pain goes away.  Take over-the-counter and prescription medicines only as told by your health care provider.  Move slowly when you sit down or stand up.  Avoid long walks if   they cause pain.  Stop or reduce your physical activities if they cause pain.  Keep all follow-up visits as told by your health care provider. This is important. Contact a health care provider if:  Your pain does not go away with treatment.  You feel pain in your back that you did not have before.  Your medicine is not helping. Get help right away if:  You have a fever or chills.  You develop uterine contractions.  You have vaginal bleeding.  You have nausea or vomiting.  You have diarrhea.  You have pain when you urinate. Summary  Round ligament pain is felt in the  lower abdomen or groin. It is usually a short, sharp, and pinching pain. It can also be a dull, lingering, and aching pain.  This pain usually begins in the second trimester (13-28 weeks). It occurs because the uterus is stretching with the growing baby, and it is not harmful to the baby.  You may notice the pain when you suddenly change position, when you cough or sneeze, or during physical activity.  Relaxing, flexing your knees to your abdomen, lying on one side, or taking a warm bath may help to get rid of the pain.  Get help from your health care provider if the pain does not go away or if you have vaginal bleeding, nausea, vomiting, diarrhea, or painful urination. This information is not intended to replace advice given to you by your health care provider. Make sure you discuss any questions you have with your health care provider. Document Revised: 05/21/2018 Document Reviewed: 05/21/2018 Elsevier Patient Education  2020 Elsevier Inc.  

## 2020-02-18 NOTE — Telephone Encounter (Signed)
Patient called saying she's having really bad pain in her right side below her rib cage causing her to throw up.   -TC

## 2020-02-18 NOTE — Progress Notes (Signed)
Pt transported to US

## 2020-02-18 NOTE — Progress Notes (Signed)
CNM updated and discharge orders given. Pt to discharge home with nausea medication and instructions on when to follow up with her provider.

## 2020-02-18 NOTE — Progress Notes (Signed)
IV started per CNM orders and medications given to patient. Pt is feeling better after IV Zofran and had no emesis since the IV medication and pain is down from 8 to 5 on pain scale following pain medication. RN awaiting results from lab then will notify CNM. Pt resting at this time while waiting for [place].

## 2020-02-18 NOTE — OB Triage Note (Signed)
Discharge instructions reviewed with the patient. Pt verbalized understanding of discharge instructions. Pt instructed to advance diet slowly until tolerating regular diet. Prescriptions reviewed with the pt and pt has no further questions at this time. Pt discharged home in stable condition with significant other.

## 2020-02-18 NOTE — Progress Notes (Signed)
CNM notified of lab and [place] results. Orders given for regular diet and Antibiotics. If pt tolerates regular diet then she can d/c home.

## 2020-02-18 NOTE — Progress Notes (Signed)
Pt ate a few bites of her food then became nauseated and started feeling bad again. Will notify CNM for instructions.

## 2020-02-18 NOTE — Telephone Encounter (Signed)
Attempted to call patient at number listed in message taken by Ardelle Lesches- no answer and mailbox is full. Mychart message sent to patient.

## 2020-02-18 NOTE — OB Triage Note (Signed)
Pt is a 18yo G1P0 at 106w2d that presents from the ED with c/o Right abdominal pain. Pt describes pain as "the worst period cramp I've ever had." Pt states the pain is constant and started around 8pm. Pt states the pain is a 8/10 pain scale and has caused nausea and vomiting this morning at least 10 times. Pt states the emesis is " Yellow, almost like a stomach bile." Pt denies VB, LOF and states decreased FM today. Monitors applied and initial FHt 145.

## 2020-02-20 ENCOUNTER — Other Ambulatory Visit: Payer: Self-pay

## 2020-02-20 ENCOUNTER — Encounter: Payer: Self-pay | Admitting: Obstetrics and Gynecology

## 2020-02-20 ENCOUNTER — Observation Stay
Admission: EM | Admit: 2020-02-20 | Discharge: 2020-02-20 | Disposition: A | Discharge status: Home / Self Care | Payer: Medicaid Other | Attending: Certified Nurse Midwife | Admitting: Certified Nurse Midwife

## 2020-02-20 DIAGNOSIS — O2342 Unspecified infection of urinary tract in pregnancy, second trimester: Secondary | ICD-10-CM | POA: Diagnosis not present

## 2020-02-20 DIAGNOSIS — O99891 Other specified diseases and conditions complicating pregnancy: Secondary | ICD-10-CM | POA: Diagnosis not present

## 2020-02-20 DIAGNOSIS — R1084 Generalized abdominal pain: Secondary | ICD-10-CM | POA: Diagnosis not present

## 2020-02-20 DIAGNOSIS — Z3A24 24 weeks gestation of pregnancy: Secondary | ICD-10-CM | POA: Diagnosis not present

## 2020-02-20 LAB — URINE CULTURE: Culture: >=100000 — AB

## 2020-02-20 LAB — URINALYSIS, ROUTINE W REFLEX MICROSCOPIC
Bilirubin Urine: NEGATIVE
Glucose, UA: NEGATIVE mg/dL
Ketones, ur: 20 mg/dL — AB
Nitrite: NEGATIVE
Protein, ur: NEGATIVE mg/dL
Specific Gravity, Urine: 1.004 — ABNORMAL LOW (ref 1.005–1.030)
pH: 6 (ref 5.0–8.0)

## 2020-02-20 MED ORDER — ACETAMINOPHEN 325 MG PO TABS: 650.0000 mg | ORAL_TABLET | ORAL | Status: DC | PRN

## 2020-02-20 MED ORDER — OXYCODONE-ACETAMINOPHEN 5-325 MG PO TABS: 1.0000 | ORAL_TABLET | Freq: Four times a day (QID) | ORAL | 0 refills | Status: DC | PRN

## 2020-02-20 MED ORDER — SODIUM CHLORIDE 0.9 % IV SOLN
1.0000 g | Freq: Once | INTRAVENOUS | Status: AC
  Administered 2020-02-20: 1 g via INTRAVENOUS
  Filled 2020-02-20: qty 1

## 2020-02-20 MED ORDER — SODIUM CHLORIDE 0.9 % IV SOLN
8.0000 mg | Freq: Once | INTRAVENOUS | Status: AC
  Administered 2020-02-20: 8 mg via INTRAVENOUS
  Filled 2020-02-20: qty 4

## 2020-02-20 MED ORDER — LACTATED RINGERS IV SOLN: INTRAVENOUS | Status: DC

## 2020-02-20 MED ORDER — OXYCODONE-ACETAMINOPHEN 5-325 MG PO TABS
1.0000 | ORAL_TABLET | Freq: Four times a day (QID) | ORAL | Status: DC | PRN
  Administered 2020-02-20 (×2): 1 via ORAL
  Filled 2020-02-20 (×2): qty 1

## 2020-02-20 NOTE — OB Triage Note (Signed)
Patient here for abdominal pain that has gotten much worse since she was seen a few days ago. She reports it now feels like a knife stabbing her in the side and is on both sides she is also having new pain under her breasts on both sides. Afebrile, she does report taking tylenol regularly and feeling hot but did not take her temp. She has continued to vomit since last here and is not able to keep much down. She has continued to take antibiotics.

## 2020-02-20 NOTE — OB Triage Note (Signed)
    L&D OB Triage Note  SUBJECTIVE Haley Robles is a 19 y.o. G1P0 female at [redacted]w[redacted]d, EDD Estimated Date of Delivery: 06/07/20 who presented to triage with complaints of nausea and bilateral side pain. She was seen on 02/18/20 and diagnosed with UTI. She denies contractions, loss of fluid , and vaginal bleeding.   OB History  Gravida Para Term Preterm AB Living  1 0 0 0 0 0  SAB TAB Ectopic Multiple Live Births  0 0 0 0 0    # Outcome Date GA Lbr Len/2nd Weight Sex Delivery Anes PTL Lv  1 Current             Medications Prior to Admission  Medication Sig Dispense Refill Last Dose  . loperamide (IMODIUM) 2 MG capsule Take 2 capsules (4 mg total) by mouth as needed for diarrhea or loose stools. 30 capsule 0 Past Week at Unknown time  . nitrofurantoin, macrocrystal-monohydrate, (MACROBID) 100 MG capsule Take 1 capsule (100 mg total) by mouth every 12 (twelve) hours. 13 capsule 0 02/20/2020 at Unknown time  . ondansetron (ZOFRAN ODT) 4 MG disintegrating tablet Take 1 tablet (4 mg total) by mouth every 6 (six) hours as needed for nausea. 20 tablet 0 Past Week at Unknown time  . Prenatal Vit-Fe Fumarate-FA (PRENATAL MULTIVITAMIN) TABS tablet Take 1 tablet by mouth daily at 12 noon.   Past Week at Unknown time     OBJECTIVE  Nursing Evaluation:   BP 116/62 (BP Location: Right Arm)   Pulse (!) 108   Temp 98.6 F (37 C) (Axillary)   Resp 16   Ht 5\' 4"  (1.626 m)   Wt 54.4 kg   LMP 08/26/2019   SpO2 99%   BMI 20.60 kg/m    Findings:       nausea and pain due to UTI in pregnancy       NST was performed and has been reviewed by me.  NST INTERPRETATION: Category I  Mode: Doppler Baseline Rate (A): 140 bpm(fht)  Contraction Frequency (min): None noted  ASSESSMENT Impression:  1.  Pregnancy:  G1P0 at [redacted]w[redacted]d , EDD Estimated Date of Delivery: 06/07/20 2.  Reassuring fetal and maternal status 3.  UTI  4.rocephin 1 g IV x 1  PLAN 1. Discussed current condition and above findings with  patient and reassurance given.  All questions answered.Continue PO antibiotics and nausea medication as needed. Pain medication ordered 2. Discharge home with standard labor precautions given to return to L&D or call the office for problems. 3. Continue routine prenatal care as scheduled. 4. Dr. 06/09/20 consulted on plan of care  Valentino Saxon, CNM

## 2020-02-21 NOTE — Discharge Summary (Signed)
Antenatal Physician Discharge Summary  Patient ID: Haley Robles MRN: [id number] DOB/AGE: 21-02-2001 21 y.o.  Admit date: 02/18/2020 Discharge date: 02/18/2020  Admission Diagnoses: Nausea and vomiting in pregnancy  Discharge Diagnoses: Urinary tract infection  Prenatal Procedures: RUQ ultrasound and medication, see MAR  Significant Diagnostic Studies:  Results for orders placed or performed during the hospital encounter of 02/20/20 (from the past 168 hour(s))  Urinalysis, Routine w reflex microscopic   Collection Time: 02/20/20  4:33 PM  Result Value Ref Range   Color, Urine YELLOW (A) YELLOW   APPearance CLEAR (A) CLEAR   Specific Gravity, Urine 1.004 (L) 1.005 - 1.030   pH 6.0 5.0 - 8.0   Glucose, UA NEGATIVE NEGATIVE mg/dL   Hgb urine dipstick SMALL (A) NEGATIVE   Bilirubin Urine NEGATIVE NEGATIVE   Ketones, ur 20 (A) NEGATIVE mg/dL   Protein, ur NEGATIVE NEGATIVE mg/dL   Nitrite NEGATIVE NEGATIVE   Leukocytes,Ua TRACE (A) NEGATIVE   RBC / HPF 0-5 0 - 5 RBC/hpf   WBC, UA 6-10 0 - 5 WBC/hpf   Bacteria, UA RARE (A) NONE SEEN   Squamous Epithelial / LPF 6-10 0 - 5  Results for orders placed or performed during the hospital encounter of 02/18/20 (from the past 168 hour(s))  Urine Culture   Collection Time: 02/18/20 11:44 AM   Specimen: Urine, Random  Result Value Ref Range   Specimen Description      URINE, RANDOM Performed at [facility], [address] [place], [place] [address]    Special Requests      NONE Performed at [facility], [address] [place], [place] [address]    Culture >=100,000 COLONIES/mL ESCHERICHIA COLI (A)    Report Status 02/20/2020 FINAL    Organism ID, Bacteria ESCHERICHIA COLI (A)       Susceptibility   Escherichia coli - MIC*    AMPICILLIN <=2 SENSITIVE Sensitive     CEFAZOLIN <=4 SENSITIVE Sensitive     CEFTRIAXONE <=0.25 SENSITIVE Sensitive     CIPROFLOXACIN <=0.25 SENSITIVE Sensitive     GENTAMICIN <=1  SENSITIVE Sensitive     IMIPENEM <=0.25 SENSITIVE Sensitive     NITROFURANTOIN <=16 SENSITIVE Sensitive     TRIMETH/SULFA <=20 SENSITIVE Sensitive     AMPICILLIN/SULBACTAM <=2 SENSITIVE Sensitive     PIP/TAZO <=4 SENSITIVE Sensitive     * >=100,000 COLONIES/mL ESCHERICHIA COLI  Urinalysis, Complete w Microscopic   Collection Time: 02/18/20 11:44 AM  Result Value Ref Range   Color, Urine YELLOW (A) YELLOW   APPearance CLOUDY (A) CLEAR   Specific Gravity, Urine 1.014 1.005 - 1.030   pH 6.0 5.0 - 8.0   Glucose, UA NEGATIVE NEGATIVE mg/dL   Hgb urine dipstick MODERATE (A) NEGATIVE   Bilirubin Urine NEGATIVE NEGATIVE   Ketones, ur 20 (A) NEGATIVE mg/dL   Protein, ur 035 (A) NEGATIVE mg/dL   Nitrite NEGATIVE NEGATIVE   Leukocytes,Ua LARGE (A) NEGATIVE   RBC / HPF >50 (H) 0 - 5 RBC/hpf   WBC, UA >50 (H) 0 - 5 WBC/hpf   Bacteria, UA RARE (A) NONE SEEN   Squamous Epithelial / LPF 0-5 0 - 5   Mucus PRESENT    Budding Yeast PRESENT   Respiratory Panel by RT PCR (Flu A&B, Covid) - Nasopharyngeal Swab   Collection Time: 02/18/20 12:21 PM   Specimen: Nasopharyngeal Swab  Result Value Ref Range   SARS Coronavirus 2 by RT PCR NEGATIVE NEGATIVE   Influenza A by  PCR NEGATIVE NEGATIVE   Influenza B by PCR NEGATIVE NEGATIVE    Treatments: IV hydration, analgesia: Morphine and Zofran  [facility] OB Triage Note:   This is a 19 y.o. G1P0 with IUP at [redacted]w[redacted]d admitted for nausea and vomiting in pregnancy.  She was evaluated by nursing with no significant findings for acute abdomen, preterm labor or fetal distress.   Vital signs stable.  An NST was preformed and reviewed by CNM.   Her pain was managed with single dose of IV medication and she was able to tolerate bland diet.   NST INTERPRETATION: Indications: rule out uterine contractions  Mode: External Baseline Rate (A): 150 bpm(fht) Variability: Moderate Accelerations: 10 x 10 Decelerations: None     Contraction Frequency  (min): none  Impression: reactive   Plan: NST performed was reviewed and was found to be reactive. She was discharged home with bleeding/labor precautions.  Prescriptions for Macrobid and Zofran were sent to her pharmacy on file. Continue routine prenatal care. Follow up with midwife as previously scheduled.    Discharge Condition: Stable  Disposition: Discharge disposition: 01-Home or Self Care       Discharge Instructions    Discharge activity:  No Restrictions   Complete by: As directed    Discharge diet:   Complete by: As directed    Advance as tolerated   Discharge instructions   Complete by: As directed    See AVS   No sexual activity restrictions   Complete by: As directed    Notify physician for a general feeling that "something is not right"   Complete by: As directed    Notify physician for increase or change in vaginal discharge   Complete by: As directed    Notify physician for intestinal cramps, with or without diarrhea, sometimes described as "gas pain"   Complete by: As directed    Notify physician for leaking of fluid   Complete by: As directed    Notify physician for low, dull backache, unrelieved by heat or Tylenol   Complete by: As directed    Notify physician for menstrual like cramps   Complete by: As directed    Notify physician for pelvic pressure   Complete by: As directed    Notify physician for uterine contractions.  These may be painless and feel like the uterus is tightening or the baby is  "balling up"   Complete by: As directed    Notify physician for vaginal bleeding   Complete by: As directed    PRETERM LABOR:  Includes any of the follwing symptoms that occur between 20 - [redacted] weeks gestation.  If these symptoms are not stopped, preterm labor can result in preterm delivery, placing your baby at risk   Complete by: As directed      Allergies as of 02/18/2020   No Known Allergies     Medication List    TAKE these medications    loperamide 2 MG capsule Commonly known as: IMODIUM Take 2 capsules (4 mg total) by mouth as needed for diarrhea or loose stools.   nitrofurantoin (macrocrystal-monohydrate) 100 MG capsule Commonly known as: MACROBID Take 1 capsule (100 mg total) by mouth every 12 (twelve) hours.   ondansetron 4 MG disintegrating tablet Commonly known as: Zofran ODT Take 1 tablet (4 mg total) by mouth every 6 (six) hours as needed for nausea.   prenatal multivitamin Tabs tablet Take 1 tablet by mouth daily at 12 noon.  Gunnar Bulla, CNM [facility], [facility]

## 2020-02-22 ENCOUNTER — Ambulatory Visit (INDEPENDENT_AMBULATORY_CARE_PROVIDER_SITE_OTHER): Payer: Medicaid Other | Admitting: Certified Nurse Midwife

## 2020-02-22 ENCOUNTER — Other Ambulatory Visit: Payer: Self-pay

## 2020-02-22 VITALS — BP 94/59 | HR 106 | Wt 122.4 lb

## 2020-02-22 DIAGNOSIS — Z3A24 24 weeks gestation of pregnancy: Secondary | ICD-10-CM

## 2020-02-22 NOTE — Patient Instructions (Signed)
Healthy Weight Gain During Pregnancy, Adult A certain amount of weight gain during pregnancy is normal and healthy. How much weight you should gain depends on your overall health and a measurement called BMI (body mass index). BMI is an estimate of your body fat based on your height and weight. You can use an online calculator to figure out your BMI, or you can ask your health care provider to calculate it for you at your next visit. Your recommended pregnancy weight gain is based on your pre-pregnancy BMI. General guidelines for a healthy total weight gain during pregnancy are listed below. If your BMI at or before the start of your pregnancy is:  Less than 18.5 (underweight), you should gain 28-40 lb (13-18 kg).  18.5-24.9 (normal weight), you should gain 25-35 lb (11-16 kg).  25-29.9 (overweight), you should gain 15-25 lb (7-11 kg).  30 or higher (obese), you should gain 11-20 lb (5-9 kg). These ranges vary depending on your individual health. If you are carrying more than one baby (multiples), it may be safe to gain more weight than these recommendations. If you gain less weight than recommended, that may be safe as long as your baby is growing and developing normally. How can unhealthy weight gain affect me and my baby? Gaining too much weight during pregnancy can lead to pregnancy complications, such as:  A temporary form of diabetes that develops during pregnancy (gestational diabetes).  High blood pressure during pregnancy and protein in your urine (preeclampsia).  High blood pressure during pregnancy without protein in your urine (gestational hypertension).  Your baby having a high weight at birth, which may: ? Raise your risk of having a more difficult delivery or a surgical delivery (cesarean delivery, or C-section). ? Raise your child's risk of developing obesity during childhood. Not gaining enough weight can be life-threatening for your baby, and it may raise your baby's chances  of:  Being born early (preterm).  Growing more slowly than normal during pregnancy (growth restriction).  Having a low weight at birth. What actions can I take to gain a healthy amount of weight during pregnancy? General instructions  Keep track of your weight gain during pregnancy.  Take over-the-counter and prescription medicines only as told by your health care provider. Take all prenatal supplements as directed.  Keep all health care visits during pregnancy (prenatal visits). These visits are a good time to discuss your weight gain. Your health care provider will weigh you at each visit to make sure you are gaining a healthy amount of weight. Nutrition   Eat a balanced, nutrient-rich diet. Eat plenty of: ? Fruits and vegetables, such as berries and broccoli. ? Whole grains, such as millet, barley, whole-wheat breads and cereals, and oatmeal. ? Low-fat dairy products or non-dairy products such as almond milk or rice milk. ? Protein foods, such as lean meat, chicken, eggs, and legumes (such as peas, beans, soybeans, and lentils).  Avoid foods that are fried or have a lot of fat, salt (sodium), or sugar.  Drink enough fluid to keep your urine pale yellow.  Choose healthy snack and drink options when you are at work or on the go: ? Drink water. Avoid soda, sports drinks, and juices that have added sugar. ? Avoid drinks with caffeine, such as coffee and energy drinks. ? Eat snacks that are high in protein, such as nuts, protein bars, and low-fat yogurt. ? Carry convenient snacks in your purse that do not need refrigeration, such as a pack of   trail mix, an apple, or a granola bar.  If you need help improving your diet, work with a health care provider or a diet and nutrition specialist (dietitian). Activity   Exercise regularly, as told by your health care provider. ? If you were active before becoming pregnant, you may be able to continue your regular fitness activities. ? If  you were not active before pregnancy, you may gradually build up to exercising for 30 or more minutes on most days of the week. This may include walking, swimming, or yoga.  Ask your health care provider what activities are safe for you. Talk with your health care provider about whether you may need to be excused from certain school or work activities. Where to find more information Learn more about managing your weight gain during pregnancy from:  American Pregnancy Association: www.americanpregnancy.org  [place] Department of Agriculture pregnancy weight gain calculator: https://ball-collins.biz/ Summary  Too much weight gain during pregnancy can lead to complications for you and your baby.  Find out your pre-pregnancy BMI to determine how much weight gain is healthy for you.  Eat nutritious foods and stay active.  Keep all of your prenatal visits as told by your health care provider. This information is not intended to replace advice given to you by your health care provider. Make sure you discuss any questions you have with your health care provider. Document Revised: 08/26/2019 Document Reviewed: 08/23/2017 Elsevier Patient Education  2020 [employer].   Second Trimester of Pregnancy  The second trimester is from week 14 through week 27 (month 4 through 6). This is often the time in pregnancy that you feel your best. Often times, morning sickness has lessened or quit. You may have more energy, and you may get hungry more often. Your unborn baby is growing rapidly. At the end of the sixth month, he or she is about 9 inches long and weighs about 1 pounds. You will likely feel the baby move between 18 and 20 weeks of pregnancy. Follow these instructions at home: Medicines  Take over-the-counter and prescription medicines only as told by your doctor. Some medicines are safe and some medicines are not safe during pregnancy.  Take a prenatal vitamin that contains at least 600 micrograms  (mcg) of folic acid.  If you have trouble pooping (constipation), take medicine that will make your stool soft (stool softener) if your doctor approves. Eating and drinking   Eat regular, healthy meals.  Avoid raw meat and uncooked cheese.  If you get low calcium from the food you eat, talk to your doctor about taking a daily calcium supplement.  Avoid foods that are high in fat and sugars, such as fried and sweet foods.  If you feel sick to your stomach (nauseous) or throw up (vomit): ? Eat 4 or 5 small meals a day instead of 3 large meals. ? Try eating a few soda crackers. ? Drink liquids between meals instead of during meals.  To prevent constipation: ? Eat foods that are high in fiber, like fresh fruits and vegetables, whole grains, and beans. ? Drink enough fluids to keep your pee (urine) clear or pale yellow. Activity  Exercise only as told by your doctor. Stop exercising if you start to have cramps.  Do not exercise if it is too hot, too humid, or if you are in a place of great height (high altitude).  Avoid heavy lifting.  Wear low-heeled shoes. Sit and stand up straight.  You can continue to have  sex unless your doctor tells you not to. Relieving pain and discomfort  Wear a good support bra if your breasts are tender.  Take warm water baths (sitz baths) to soothe pain or discomfort caused by hemorrhoids. Use hemorrhoid cream if your doctor approves.  Rest with your legs raised if you have leg cramps or low back pain.  If you develop puffy, bulging veins (varicose veins) in your legs: ? Wear support hose or compression stockings as told by your doctor. ? Raise (elevate) your feet for 15 minutes, 3-4 times a day. ? Limit salt in your food. Prenatal care  Write down your questions. Take them to your prenatal visits.  Keep all your prenatal visits as told by your doctor. This is important. Safety  Wear your seat belt when driving.  Make a list of emergency  phone numbers, including numbers for family, friends, the hospital, and police and fire departments. General instructions  Ask your doctor about the right foods to eat or for help finding a counselor, if you need these services.  Ask your doctor about local prenatal classes. Begin classes before month 6 of your pregnancy.  Do not use hot tubs, steam rooms, or saunas.  Do not douche or use tampons or scented sanitary pads.  Do not cross your legs for long periods of time.  Visit your dentist if you have not done so. Use a soft toothbrush to brush your teeth. Floss gently.  Avoid all smoking, herbs, and alcohol. Avoid drugs that are not approved by your doctor.  Do not use any products that contain nicotine or tobacco, such as cigarettes and e-cigarettes. If you need help quitting, ask your doctor.  Avoid cat litter boxes and soil used by cats. These carry germs that can cause birth defects in the baby and can cause a loss of your baby (miscarriage) or stillbirth. Contact a doctor if:  You have mild cramps or pressure in your lower belly.  You have pain when you pee (urinate).  You have bad smelling fluid coming from your vagina.  You continue to feel sick to your stomach (nauseous), throw up (vomit), or have watery poop (diarrhea).  You have a nagging pain in your belly area.  You feel dizzy. Get help right away if:  You have a fever.  You are leaking fluid from your vagina.  You have spotting or bleeding from your vagina.  You have severe belly cramping or pain.  You lose or gain weight rapidly.  You have trouble catching your breath and have chest pain.  You notice sudden or extreme puffiness (swelling) of your face, hands, ankles, feet, or legs.  You have not felt the baby move in over an hour.  You have severe headaches that do not go away when you take medicine.  You have trouble seeing. Summary  The second trimester is from week 14 through week 27 (months  4 through 6). This is often the time in pregnancy that you feel your best.  To take care of yourself and your unborn baby, you will need to eat healthy meals, take medicines only if your doctor tells you to do so, and do activities that are safe for you and your baby.  Call your doctor if you get sick or if you notice anything unusual about your pregnancy. Also, call your doctor if you need help with the right food to eat, or if you want to know what activities are safe for you. This information is  not intended to replace advice given to you by your health care provider. Make sure you discuss any questions you have with your health care provider. Document Revised: 03/27/2019 Document Reviewed: 01/08/2017 Elsevier Patient Education  [place].

## 2020-02-22 NOTE — Progress Notes (Signed)
ROB-Patient c/o intermittent UC's and loss of appetite x5 days.

## 2020-02-22 NOTE — Progress Notes (Signed)
ROB-Seen OB triage twice last week, treated for UTI. Feeling better today than any other day. Mild relief of pain with Tylenol. Anticipatory guidance regarding course of prenatal care. Reviewed red flag symptoms and when to call. RTC x 4 weeks for 28 week labs, TDaP, and ROB or sooner if needed.

## 2020-03-21 ENCOUNTER — Other Ambulatory Visit: Payer: Medicaid Other

## 2020-03-21 ENCOUNTER — Encounter: Payer: Self-pay | Admitting: Certified Nurse Midwife

## 2020-03-21 ENCOUNTER — Other Ambulatory Visit: Payer: Self-pay

## 2020-03-21 ENCOUNTER — Ambulatory Visit (INDEPENDENT_AMBULATORY_CARE_PROVIDER_SITE_OTHER): Payer: Medicaid Other | Admitting: Certified Nurse Midwife

## 2020-03-21 VITALS — BP 108/68 | HR 72 | Wt 129.4 lb

## 2020-03-21 DIAGNOSIS — Z23 Encounter for immunization: Secondary | ICD-10-CM | POA: Diagnosis not present

## 2020-03-21 DIAGNOSIS — Z3402 Encounter for supervision of normal first pregnancy, second trimester: Secondary | ICD-10-CM | POA: Diagnosis not present

## 2020-03-21 LAB — POCT URINALYSIS DIPSTICK OB
Bilirubin, UA: NEGATIVE
Blood, UA: NEGATIVE
Glucose, UA: NEGATIVE
Leukocytes, UA: NEGATIVE
Nitrite, UA: NEGATIVE
POC,PROTEIN,UA: NEGATIVE
Spec Grav, UA: 1.02 (ref 1.010–1.025)
Urobilinogen, UA: 0.2 E.U./dL
pH, UA: 5 (ref 5.0–8.0)

## 2020-03-21 MED ORDER — TETANUS-DIPHTH-ACELL PERTUSSIS 5-2.5-18.5 LF-MCG/0.5 IM SUSP
0.5000 mL | Freq: Once | INTRAMUSCULAR | Status: AC
  Administered 2020-03-21: 0.5 mL via INTRAMUSCULAR

## 2020-03-21 NOTE — Patient Instructions (Signed)
Td (Tetanus, Diphtheria) Vaccine: What You Need to Know 1. Why get vaccinated? Td vaccine can prevent tetanus and diphtheria. Tetanus enters the body through cuts or wounds. Diphtheria spreads from person to person.  TETANUS (T) causes painful stiffening of the muscles. Tetanus can lead to serious health problems, including being unable to open the mouth, having trouble swallowing and breathing, or death.  DIPHTHERIA (D) can lead to difficulty breathing, heart failure, paralysis, or death. 2. Td vaccine Td is only for children 7 years and older, adolescents, and adults.  Td is usually given as a booster dose every 10 years, but it can also be given earlier after a severe and dirty wound or burn. Another vaccine, called Tdap, that protects against pertussis, also known as "whooping cough," in addition to tetanus and diphtheria, may be used instead of Td.  Td may be given at the same time as other vaccines. 3. Talk with your health care provider Tell your vaccine provider if the person getting the vaccine:  Has had an allergic reaction after a previous dose of any vaccine that protects against tetanus or diphtheria, or has any severe, life-threatening allergies.  Has ever had Guillain-Barr Syndrome (also called GBS).  Has had severe pain or swelling after a previous dose of any vaccine that protects against tetanus or diphtheria. In some cases, your health care provider may decide to postpone Td vaccination to a future visit.  People with minor illnesses, such as a cold, may be vaccinated. People who are moderately or severely ill should usually wait until they recover before getting Td vaccine.  Your health care provider can give you more information. 4. Risks of a vaccine reaction  Pain, redness, or swelling where the shot was given, mild fever, headache, feeling tired, and nausea, vomiting, diarrhea, or stomachache sometimes happen after Td vaccine. People sometimes faint after medical  procedures, including vaccination. Tell your provider if you feel dizzy or have vision changes or ringing in the ears.  As with any medicine, there is a very remote chance of a vaccine causing a severe allergic reaction, other serious injury, or death. 5. What if there is a serious problem? An allergic reaction could occur after the vaccinated person leaves the clinic. If you see signs of a severe allergic reaction (hives, swelling of the face and throat, difficulty breathing, a fast heartbeat, dizziness, or weakness), call 9-1-1 and get the person to the nearest hospital.  For other signs that concern you, call your health care provider.  Adverse reactions should be reported to the Vaccine Adverse Event Reporting System (VAERS). Your health care provider will usually file this report, or you can do it yourself. Visit the VAERS website at www.vaers.hhs.gov or call 1-800-822-7967. VAERS is only for reporting reactions, and VAERS staff do not give medical advice. 6. The National Vaccine Injury Compensation Program The National Vaccine Injury Compensation Program (VICP) is a federal program that was created to compensate people who may have been injured by certain vaccines. Visit the VICP website at www.hrsa.gov/vaccinecompensation or call 1-800-338-2382 to learn about the program and about filing a claim. There is a time limit to file a claim for compensation. 7. How can I learn more?  Ask your health care provider.  Call your local or state health department.  Contact the Centers for Disease Control and Prevention (CDC): ? Call 1-800-232-4636 (1-800-CDC-INFO) or ? Visit CDC's website at www.cdc.gov/vaccines Vaccine Information Statement Td Vaccine (03/18/19) This information is not intended to replace advice given   to you by your health care provider. Make sure you discuss any questions you have with your health care provider. Document Revised: 04/27/2019 Document Reviewed: 03/30/2019 Elsevier  Patient Education  2020 [employer] Glucose Tolerance Test During Pregnancy Why am I having this test? The glucose tolerance test (GTT) is done to check how your body processes sugar (glucose). This is one of several tests used to diagnose diabetes that develops during pregnancy (gestational diabetes mellitus). Gestational diabetes is a temporary form of diabetes that some women develop during pregnancy. It usually occurs during the second trimester of pregnancy and goes away after delivery. Testing (screening) for gestational diabetes usually occurs between 24 and 28 weeks of pregnancy. You may have the GTT test after having a 1-hour glucose screening test if the results from that test indicate that you may have gestational diabetes. You may also have this test if:  You have a history of gestational diabetes.  You have a history of giving birth to very large babies or have experienced repeated fetal loss (stillbirth).  You have signs and symptoms of diabetes, such as: ? Changes in your vision. ? Tingling or numbness in your hands or feet. ? Changes in hunger, thirst, and urination that are not otherwise explained by your pregnancy. What is being tested? This test measures the amount of glucose in your blood at different times during a period of 3 hours. This indicates how well your body is able to process glucose. What kind of sample is taken?  Blood samples are required for this test. They are usually collected by inserting a needle into a blood vessel. How do I prepare for this test?  For 3 days before your test, eat normally. Have plenty of carbohydrate-rich foods.  Follow instructions from your health care provider about: ? Eating or drinking restrictions on the day of the test. You may be asked to not eat or drink anything other than water (fast) starting 8-10 hours before the test. ? Changing or stopping your regular medicines. Some medicines may interfere with this test. Tell a  health care provider about:  All medicines you are taking, including vitamins, herbs, eye drops, creams, and over-the-counter medicines.  Any blood disorders you have.  Any surgeries you have had.  Any medical conditions you have. What happens during the test? First, your blood glucose will be measured. This is referred to as your fasting blood glucose, since you fasted before the test. Then, you will drink a glucose solution that contains a certain amount of glucose. Your blood glucose will be measured again 1, 2, and 3 hours after drinking the solution. This test takes about 3 hours to complete. You will need to stay at the testing location during this time. During the testing period:  Do not eat or drink anything other than the glucose solution.  Do not exercise.  Do not use any products that contain nicotine or tobacco, such as cigarettes and e-cigarettes. If you need help stopping, ask your health care provider. The testing procedure may vary among health care providers and hospitals. How are the results reported? Your results will be reported as milligrams of glucose per deciliter of blood (mg/dL) or millimoles per liter (mmol/L). Your health care provider will compare your results to normal ranges that were established after testing a large group of people (reference ranges). Reference ranges may vary among labs and hospitals. For this test, common reference ranges are:  Fasting: less than 95-105 mg/dL (5.3-5.8 mmol/L).  1 hour   after drinking glucose: less than 180-190 mg/dL (10.0-10.5 mmol/L).  2 hours after drinking glucose: less than 155-165 mg/dL (8.6-9.2 mmol/L).  3 hours after drinking glucose: 140-145 mg/dL (7.8-8.1 mmol/L). What do the results mean? Results within reference ranges are considered normal, meaning that your glucose levels are well-controlled. If two or more of your blood glucose levels are high, you may be diagnosed with gestational diabetes. If only one  level is high, your health care provider may suggest repeat testing or other tests to confirm a diagnosis. Talk with your health care provider about what your results mean. Questions to ask your health care provider Ask your health care provider, or the department that is doing the test:  When will my results be ready?  How will I get my results?  What are my treatment options?  What other tests do I need?  What are my next steps? Summary  The glucose tolerance test (GTT) is one of several tests used to diagnose diabetes that develops during pregnancy (gestational diabetes mellitus). Gestational diabetes is a temporary form of diabetes that some women develop during pregnancy.  You may have the GTT test after having a 1-hour glucose screening test if the results from that test indicate that you may have gestational diabetes. You may also have this test if you have any symptoms or risk factors for gestational diabetes.  Talk with your health care provider about what your results mean. This information is not intended to replace advice given to you by your health care provider. Make sure you discuss any questions you have with your health care provider. Document Revised: 03/26/2019 Document Reviewed: 07/15/2017 Elsevier Patient Education  2020 [employer] Inc[employer]  

## 2020-03-21 NOTE — Progress Notes (Signed)
ROB doing well. Feels good movement. Glucose screen/RPR/CBC/BTC/Tdap today. Discussed birth plan, sample given. Discussed birth control post delivery, information given . Will follow up at next visit. Reviewed Ready set baby, see pregnancy check list for topics reviewed. Follow up 2 wk ROB with Marcelino Duster.   GAD 7 : Generalized Anxiety Score 03/21/2020 01/04/2020  Nervous, Anxious, on Edge 2 1  Control/stop worrying 1 1  Worry too much - different things 2 3  Trouble relaxing 0 0  Restless 0 0  Easily annoyed or irritable 0 0  Afraid - awful might happen 1 0  Total GAD 7 Score 6 5  Anxiety Difficulty Not difficult at all Not difficult at all      Routine Prenatal from 03/21/2020 in [facility]  PHQ-9 Total Score  10

## 2020-03-22 ENCOUNTER — Other Ambulatory Visit: Payer: Self-pay | Admitting: Certified Nurse Midwife

## 2020-03-22 LAB — CBC
Hematocrit: 31.7 % — ABNORMAL LOW (ref 34.0–46.6)
Hemoglobin: 10.9 g/dL — ABNORMAL LOW (ref 11.1–15.9)
MCH: 31.1 pg (ref 26.6–33.0)
MCHC: 34.4 g/dL (ref 31.5–35.7)
MCV: 90 fL (ref 79–97)
Platelets: 204 10*3/uL (ref 150–450)
RBC: 3.51 x10E6/uL — ABNORMAL LOW (ref 3.77–5.28)
RDW: 12.7 % (ref 11.7–15.4)
WBC: 8.4 10*3/uL (ref 3.4–10.8)

## 2020-03-22 LAB — RPR: RPR Ser Ql: NONREACTIVE

## 2020-03-22 LAB — GLUCOSE, 1 HOUR GESTATIONAL: Gestational Diabetes Screen: 119 mg/dL (ref 65–139)

## 2020-03-22 MED ORDER — FUSION PLUS PO CAPS: 1.0000 | ORAL_CAPSULE | Freq: Every day | ORAL | 6 refills | Status: DC

## 2020-04-04 ENCOUNTER — Ambulatory Visit (INDEPENDENT_AMBULATORY_CARE_PROVIDER_SITE_OTHER): Payer: Medicaid Other | Admitting: Certified Nurse Midwife

## 2020-04-04 ENCOUNTER — Other Ambulatory Visit: Payer: Self-pay

## 2020-04-04 ENCOUNTER — Encounter: Payer: Self-pay | Admitting: Certified Nurse Midwife

## 2020-04-04 VITALS — BP 89/55 | HR 78 | Wt 131.4 lb

## 2020-04-04 DIAGNOSIS — Z3403 Encounter for supervision of normal first pregnancy, third trimester: Secondary | ICD-10-CM

## 2020-04-04 DIAGNOSIS — F329 Major depressive disorder, single episode, unspecified: Secondary | ICD-10-CM

## 2020-04-04 DIAGNOSIS — Z87440 Personal history of urinary (tract) infections: Secondary | ICD-10-CM | POA: Insufficient documentation

## 2020-04-04 DIAGNOSIS — Z87898 Personal history of other specified conditions: Secondary | ICD-10-CM

## 2020-04-04 DIAGNOSIS — F32A Depression, unspecified: Secondary | ICD-10-CM | POA: Insufficient documentation

## 2020-04-04 DIAGNOSIS — F1291 Cannabis use, unspecified, in remission: Secondary | ICD-10-CM | POA: Insufficient documentation

## 2020-04-04 DIAGNOSIS — Z3A30 30 weeks gestation of pregnancy: Secondary | ICD-10-CM

## 2020-04-04 DIAGNOSIS — F419 Anxiety disorder, unspecified: Secondary | ICD-10-CM

## 2020-04-04 LAB — POCT URINALYSIS DIPSTICK OB
Bilirubin, UA: NEGATIVE
Blood, UA: NEGATIVE
Glucose, UA: NEGATIVE
Ketones, UA: NEGATIVE
Nitrite, UA: NEGATIVE
POC,PROTEIN,UA: NEGATIVE
Spec Grav, UA: 1.015 (ref 1.010–1.025)
Urobilinogen, UA: 0.2 E.U./dL
pH, UA: 6.5 (ref 5.0–8.0)

## 2020-04-04 MED ORDER — NITROFURANTOIN MONOHYD MACRO 100 MG PO CAPS: 100.0000 mg | ORAL_CAPSULE | Freq: Two times a day (BID) | ORAL | 2 refills | Status: DC

## 2020-04-04 NOTE — Progress Notes (Signed)
ROB-Pt present for routine prenatal care. Pt noticing some vaginal spotting a small amount of blood. No other issues. +

## 2020-04-04 NOTE — Patient Instructions (Addendum)
Nitrofurantoin tablets or capsules What is this medicine? NITROFURANTOIN (nye troe fyoor AN toyn) is an antibiotic. It is used to treat urinary tract infections. This medicine may be used for other purposes; ask your health care provider or pharmacist if you have questions. COMMON BRAND NAME(S): Macrobid, Macrodantin, Urotoin What should I tell my health care provider before I take this medicine? They need to know if you have any of these conditions:  anemia  diabetes  glucose-6-phosphate dehydrogenase deficiency  kidney disease  liver disease  lung disease  other chronic illness  an unusual or allergic reaction to nitrofurantoin, other antibiotics, other medicines, foods, dyes or preservatives  pregnant or trying to get pregnant  breast-feeding How should I use this medicine? Take this medicine by mouth with a glass of water. Follow the directions on the prescription label. Take this medicine with food or milk. Take your doses at regular intervals. Do not take your medicine more often than directed. Do not stop taking except on your doctor's advice. Talk to your pediatrician regarding the use of this medicine in children. While this drug may be prescribed for selected conditions, precautions do apply. Overdosage: If you think you have taken too much of this medicine contact a poison control center or emergency room at once. NOTE: This medicine is only for you. Do not share this medicine with others. What if I miss a dose? If you miss a dose, take it as soon as you can. If it is almost time for your next dose, take only that dose. Do not take double or extra doses. What may interact with this medicine?  antacids containing magnesium trisilicate  probenecid  quinolone antibiotics like ciprofloxacin, lomefloxacin, norfloxacin and ofloxacin  sulfinpyrazone This list may not describe all possible interactions. Give your health care provider a list of all the medicines, herbs,  non-prescription drugs, or dietary supplements you use. Also tell them if you smoke, drink alcohol, or use illegal drugs. Some items may interact with your medicine. What should I watch for while using this medicine? Tell your doctor or health care professional if your symptoms do not improve or if you get new symptoms. Drink several glasses of water a day. If you are taking this medicine for a long time, visit your doctor for regular checks on your progress. If you are diabetic, you may get a false positive result for sugar in your urine with certain brands of urine tests. Check with your doctor. What side effects may I notice from receiving this medicine? Side effects that you should report to your doctor or health care professional as soon as possible:  allergic reactions like skin rash or hives, swelling of the face, lips, or tongue  chest pain  cough  difficulty breathing  dizziness, drowsiness  fever or infection  joint aches or pains  pale or blue-tinted skin  redness, blistering, peeling or loosening of the skin, including inside the mouth  tingling, burning, pain, or numbness in hands or feet  unusual bleeding or bruising  unusually weak or tired  yellowing of eyes or skin Side effects that usually do not require medical attention (report to your doctor or health care professional if they continue or are bothersome):  dark urine  diarrhea  headache  loss of appetite  nausea or vomiting  temporary hair loss This list may not describe all possible side effects. Call your doctor for medical advice about side effects. You may report side effects to FDA at 1-800-FDA-1088. Where should  I keep my medicine? Keep out of the reach of children. Store at room temperature between 15 and 30 degrees C (59 and 86 degrees F). Protect from light. Throw away any unused medicine after the expiration date. NOTE: This sheet is a summary. It may not cover all possible information.  If you have questions about this medicine, talk to your doctor, pharmacist, or health care provider.  2020 Elsevier/Gold Standard (2008-06-23 15:56:47)   Third Trimester of Pregnancy  The third trimester is from week 28 through week 40 (months 7 through 9). This trimester is when your unborn baby (fetus) is growing very fast. At the end of the ninth month, the unborn baby is about 20 inches in length. It weighs about 6-10 pounds. Follow these instructions at home: Medicines  Take over-the-counter and prescription medicines only as told by your doctor. Some medicines are safe and some medicines are not safe during pregnancy.  Take a prenatal vitamin that contains at least 600 micrograms (mcg) of folic acid.  If you have trouble pooping (constipation), take medicine that will make your stool soft (stool softener) if your doctor approves. Eating and drinking   Eat regular, healthy meals.  Avoid raw meat and uncooked cheese.  If you get low calcium from the food you eat, talk to your doctor about taking a daily calcium supplement.  Eat four or five small meals rather than three large meals a day.  Avoid foods that are high in fat and sugars, such as fried and sweet foods.  To prevent constipation: ? Eat foods that are high in fiber, like fresh fruits and vegetables, whole grains, and beans. ? Drink enough fluids to keep your pee (urine) clear or pale yellow. Activity  Exercise only as told by your doctor. Stop exercising if you start to have cramps.  Avoid heavy lifting, wear low heels, and sit up straight.  Do not exercise if it is too hot, too humid, or if you are in a place of great height (high altitude).  You may continue to have sex unless your doctor tells you not to. Relieving pain and discomfort  Wear a good support bra if your breasts are tender.  Take frequent breaks and rest with your legs raised if you have leg cramps or low back pain.  Take warm water baths  (sitz baths) to soothe pain or discomfort caused by hemorrhoids. Use hemorrhoid cream if your doctor approves.  If you develop puffy, bulging veins (varicose veins) in your legs: ? Wear support hose or compression stockings as told by your doctor. ? Raise (elevate) your feet for 15 minutes, 3-4 times a day. ? Limit salt in your food. Safety  Wear your seat belt when driving.  Make a list of emergency phone numbers, including numbers for family, friends, the hospital, and police and fire departments. Preparing for your baby's arrival To prepare for the arrival of your baby:  Take prenatal classes.  Practice driving to the hospital.  Visit the hospital and tour the maternity area.  Talk to your work about taking leave once the baby comes.  Pack your hospital bag.  Prepare the baby's room.  Go to your doctor visits.  Buy a rear-facing car seat. Learn how to install it in your car. General instructions  Do not use hot tubs, steam rooms, or saunas.  Do not use any products that contain nicotine or tobacco, such as cigarettes and e-cigarettes. If you need help quitting, ask your doctor.  Do not  drink alcohol.  Do not douche or use tampons or scented sanitary pads.  Do not cross your legs for long periods of time.  Do not travel for long distances unless you must. Only do so if your doctor says it is okay.  Visit your dentist if you have not gone during your pregnancy. Use a soft toothbrush to brush your teeth. Be gentle when you floss.  Avoid cat litter boxes and soil used by cats. These carry germs that can cause birth defects in the baby and can cause a loss of your baby (miscarriage) or stillbirth.  Keep all your prenatal visits as told by your doctor. This is important. Contact a doctor if:  You are not sure if you are in labor or if your water has broken.  You are dizzy.  You have mild cramps or pressure in your lower belly.  You have a nagging pain in your  belly area.  You continue to feel sick to your stomach, you throw up, or you have watery poop.  You have bad smelling fluid coming from your vagina.  You have pain when you pee. Get help right away if:  You have a fever.  You are leaking fluid from your vagina.  You are spotting or bleeding from your vagina.  You have severe belly cramps or pain.  You lose or gain weight quickly.  You have trouble catching your breath and have chest pain.  You notice sudden or extreme puffiness (swelling) of your face, hands, ankles, feet, or legs.  You have not felt the baby move in over an hour.  You have severe headaches that do not go away with medicine.  You have trouble seeing.  You are leaking, or you are having a gush of fluid, from your vagina before you are 37 weeks.  You have regular belly spasms (contractions) before you are 37 weeks. Summary  The third trimester is from week 28 through week 40 (months 7 through 9). This time is when your unborn baby is growing very fast.  Follow your doctor's advice about medicine, food, and activity.  Get ready for the arrival of your baby by taking prenatal classes, getting all the baby items ready, preparing the baby's room, and visiting your doctor to be checked.  Get help right away if you are bleeding from your vagina, or you have chest pain and trouble catching your breath, or if you have not felt your baby move in over an hour. This information is not intended to replace advice given to you by your health care provider. Make sure you discuss any questions you have with your health care provider. Document Revised: 03/26/2019 Document Reviewed: 01/08/2017 Elsevier Patient Education  [place]. Common Medications Safe in Pregnancy  Acne:      Constipation:  Benzoyl Peroxide     Colace  Clindamycin      Dulcolax Suppository  Topica Erythromycin     Fibercon  Salicylic Acid      Metamucil         Miralax AVOID:                Senakot   Accutane    Cough:  Retin-A       Cough Drops  Tetracycline      Phenergan w/ Codeine if Rx  Minocycline      Robitussin (Plain & DM)  Antibiotics:     Crabs/Lice:  Ceclor       RID  Cephalosporins  AVOID:  E-Mycins      Kwell  Keflex  Macrobid/Macrodantin   Diarrhea:  Penicillin      Kao-Pectate  Zithromax      Imodium AD         PUSH FLUIDS AVOID:       Cipro     Fever:  Tetracycline      Tylenol (Regular or Extra  Minocycline       Strength)  Levaquin      Extra Strength-Do not          Exceed 8 tabs/24 hrs Caffeine:        <277m/day (equiv. To 1 cup of coffee or  approx. 3 12 oz sodas)         Gas: Cold/Hayfever:       Gas-X  Benadryl      Mylicon  Claritin       Phazyme  **Claritin-D        Chlor-Trimeton    Headaches:  Dimetapp      ASA-Free Excedrin  Drixoral-Non-Drowsy     Cold Compress  Mucinex (Guaifenasin)     Tylenol (Regular or Extra  Sudafed/Sudafed-12 Hour     Strength)  **Sudafed PE Pseudoephedrine   Tylenol Cold & Sinus     Vicks Vapor Rub  Zyrtec  **AVOID if Problems With Blood Pressure         Heartburn: Avoid lying down for at least 1 hour after meals  Aciphex      Maalox     Rash:  Milk of Magnesia     Benadryl    Mylanta       1% Hydrocortisone Cream  Pepcid  Pepcid Complete   Sleep Aids:  Prevacid      Ambien   Prilosec       Benadryl  Rolaids       Chamomile Tea  Tums (Limit 4/day)     Unisom  Zantac       Tylenol PM         Warm milk-add vanilla or  Hemorrhoids:       Sugar for taste  Anusol/Anusol H.C.  (RX: Analapram 2.5%)  Sugar Substitutes:  Hydrocortisone OTC     Ok in moderation  Preparation H      Tucks        Vaseline lotion applied to tissue with wiping    Herpes:     Throat:  Acyclovir      Oragel  Famvir  Valtrex     Vaccines:         Flu Shot Leg Cramps:       *Gardasil  Benadryl      Hepatitis A         Hepatitis B Nasal Spray:       Pneumovax  Saline Nasal Spray     Polio  Booster         Tetanus Nausea:       Tuberculosis test or PPD  Vitamin B6 25 mg TID   AVOID:    Dramamine      *Gardasil  Emetrol       Live Poliovirus  Ginger Root 250 mg QID    MMR (measles, mumps &  High Complex Carbs @ Bedtime    rebella)  Sea Bands-Accupressure    Varicella (Chickenpox)  Unisom 1/2 tab TID     *No known complications           If received before Pain:  Known pregnancy;   Darvocet       Resume series after  Lortab        Delivery  Percocet    Yeast:   Tramadol      Femstat  Tylenol 3      Gyne-lotrimin  Ultram       Monistat  Vicodin           MISC:         All Sunscreens           Hair Coloring/highlights          Insect Repellant's          (Including DEET)         Mystic Tans Breastfeeding  Choosing to breastfeed is one of the best decisions you can make for yourself and your baby. A change in hormones during pregnancy causes your breasts to make breast milk in your milk-producing glands. Hormones prevent breast milk from being released before your baby is born. They also prompt milk flow after birth. Once breastfeeding has begun, thoughts of your baby, as well as his or her sucking or crying, can stimulate the release of milk from your milk-producing glands. Benefits of breastfeeding Research shows that breastfeeding offers many health benefits for infants and mothers. It also offers a cost-free and convenient way to feed your baby. For your baby  Your first milk (colostrum) helps your baby's digestive system to function better.  Special cells in your milk (antibodies) help your baby to fight off infections.  Breastfed babies are less likely to develop asthma, allergies, obesity, or type 2 diabetes. They are also at lower risk for sudden infant death syndrome (SIDS).  Nutrients in breast milk are better able to meet your baby's needs compared to infant formula.  Breast milk improves your baby's brain development. For you  Breastfeeding helps  to create a very special bond between you and your baby.  Breastfeeding is convenient. Breast milk costs nothing and is always available at the correct temperature.  Breastfeeding helps to burn calories. It helps you to lose the weight that you gained during pregnancy.  Breastfeeding makes your uterus return faster to its size before pregnancy. It also slows bleeding (lochia) after you give birth.  Breastfeeding helps to lower your risk of developing type 2 diabetes, osteoporosis, rheumatoid arthritis, cardiovascular disease, and breast, ovarian, uterine, and endometrial cancer later in life. Breastfeeding basics Starting breastfeeding  Find a comfortable place to sit or lie down, with your neck and back well-supported.  Place a pillow or a rolled-up blanket under your baby to bring him or her to the level of your breast (if you are seated). Nursing pillows are specially designed to help support your arms and your baby while you breastfeed.  Make sure that your baby's tummy (abdomen) is facing your abdomen.  Gently massage your breast. With your fingertips, massage from the outer edges of your breast inward toward the nipple. This encourages milk flow. If your milk flows slowly, you may need to continue this action during the feeding.  Support your breast with 4 fingers underneath and your thumb above your nipple (make the letter "C" with your hand). Make sure your fingers are well away from your nipple and your baby's mouth.  Stroke your baby's lips gently with your finger or nipple.  When your baby's mouth is open wide enough, quickly bring your baby to your breast, placing your entire nipple and as much of the areola as  possible into your baby's mouth. The areola is the colored area around your nipple. ? More areola should be visible above your baby's upper lip than below the lower lip. ? Your baby's lips should be opened and extended outward (flanged) to ensure an adequate, comfortable  latch. ? Your baby's tongue should be between his or her lower gum and your breast.  Make sure that your baby's mouth is correctly positioned around your nipple (latched). Your baby's lips should create a seal on your breast and be turned out (everted).  It is common for your baby to suck about 2-3 minutes in order to start the flow of breast milk. Latching Teaching your baby how to latch onto your breast properly is very important. An improper latch can cause nipple pain, decreased milk supply, and poor weight gain in your baby. Also, if your baby is not latched onto your nipple properly, he or she may swallow some air during feeding. This can make your baby fussy. Burping your baby when you switch breasts during the feeding can help to get rid of the air. However, teaching your baby to latch on properly is still the best way to prevent fussiness from swallowing air while breastfeeding. Signs that your baby has successfully latched onto your nipple  Silent tugging or silent sucking, without causing you pain. Infant's lips should be extended outward (flanged).  Swallowing heard between every 3-4 sucks once your milk has started to flow (after your let-down milk reflex occurs).  Muscle movement above and in front of his or her ears while sucking. Signs that your baby has not successfully latched onto your nipple  Sucking sounds or smacking sounds from your baby while breastfeeding.  Nipple pain. If you think your baby has not latched on correctly, slip your finger into the corner of your baby's mouth to break the suction and place it between your baby's gums. Attempt to start breastfeeding again. Signs of successful breastfeeding Signs from your baby  Your baby will gradually decrease the number of sucks or will completely stop sucking.  Your baby will fall asleep.  Your baby's body will relax.  Your baby will retain a small amount of milk in his or her mouth.  Your baby will let go of  your breast by himself or herself. Signs from you  Breasts that have increased in firmness, weight, and size 1-3 hours after feeding.  Breasts that are softer immediately after breastfeeding.  Increased milk volume, as well as a change in milk consistency and color by the fifth day of breastfeeding.  Nipples that are not sore, cracked, or bleeding. Signs that your baby is getting enough milk  Wetting at least 1-2 diapers during the first 24 hours after birth.  Wetting at least 5-6 diapers every 24 hours for the first week after birth. The urine should be clear or pale yellow by the age of 5 days.  Wetting 6-8 diapers every 24 hours as your baby continues to grow and develop.  At least 3 stools in a 24-hour period by the age of 5 days. The stool should be soft and yellow.  At least 3 stools in a 24-hour period by the age of 7 days. The stool should be seedy and yellow.  No loss of weight greater than 10% of birth weight during the first 3 days of life.  Average weight gain of 4-7 oz (113-198 g) per week after the age of 4 days.  Consistent daily weight gain by the  age of 5 days, without weight loss after the age of 2 weeks. After a feeding, your baby may spit up a small amount of milk. This is normal. Breastfeeding frequency and duration Frequent feeding will help you make more milk and can prevent sore nipples and extremely full breasts (breast engorgement). Breastfeed when you feel the need to reduce the fullness of your breasts or when your baby shows signs of hunger. This is called "breastfeeding on demand." Signs that your baby is hungry include:  Increased alertness, activity, or restlessness.  Movement of the head from side to side.  Opening of the mouth when the corner of the mouth or cheek is stroked (rooting).  Increased sucking sounds, smacking lips, cooing, sighing, or squeaking.  Hand-to-mouth movements and sucking on fingers or hands.  Fussing or crying. Avoid  introducing a pacifier to your baby in the first 4-6 weeks after your baby is born. After this time, you may choose to use a pacifier. Research has shown that pacifier use during the first year of a baby's life decreases the risk of sudden infant death syndrome (SIDS). Allow your baby to feed on each breast as long as he or she wants. When your baby unlatches or falls asleep while feeding from the first breast, offer the second breast. Because newborns are often sleepy in the first few weeks of life, you may need to awaken your baby to get him or her to feed. Breastfeeding times will vary from baby to baby. However, the following rules can serve as a guide to help you make sure that your baby is properly fed:  Newborns (babies 63 weeks of age or younger) may breastfeed every 1-3 hours.  Newborns should not go without breastfeeding for longer than 3 hours during the day or 5 hours during the night.  You should breastfeed your baby a minimum of 8 times in a 24-hour period. Breast milk pumping     Pumping and storing breast milk allows you to make sure that your baby is exclusively fed your breast milk, even at times when you are unable to breastfeed. This is especially important if you go back to work while you are still breastfeeding, or if you are not able to be present during feedings. Your lactation consultant can help you find a method of pumping that works best for you and give you guidelines about how long it is safe to store breast milk. Caring for your breasts while you breastfeed Nipples can become dry, cracked, and sore while breastfeeding. The following recommendations can help keep your breasts moisturized and healthy:  Avoid using soap on your nipples.  Wear a supportive bra designed especially for nursing. Avoid wearing underwire-style bras or extremely tight bras (sports bras).  Air-dry your nipples for 3-4 minutes after each feeding.  Use only cotton bra pads to absorb leaked  breast milk. Leaking of breast milk between feedings is normal.  Use lanolin on your nipples after breastfeeding. Lanolin helps to maintain your skin's normal moisture barrier. Pure lanolin is not harmful (not toxic) to your baby. You may also hand express a few drops of breast milk and gently massage that milk into your nipples and allow the milk to air-dry. In the first few weeks after giving birth, some women experience breast engorgement. Engorgement can make your breasts feel heavy, warm, and tender to the touch. Engorgement peaks within 3-5 days after you give birth. The following recommendations can help to ease engorgement:  Completely empty your  breasts while breastfeeding or pumping. You may want to start by applying warm, moist heat (in the shower or with warm, water-soaked hand towels) just before feeding or pumping. This increases circulation and helps the milk flow. If your baby does not completely empty your breasts while breastfeeding, pump any extra milk after he or she is finished.  Apply ice packs to your breasts immediately after breastfeeding or pumping, unless this is too uncomfortable for you. To do this: ? Put ice in a plastic bag. ? Place a towel between your skin and the bag. ? Leave the ice on for 20 minutes, 2-3 times a day.  Make sure that your baby is latched on and positioned properly while breastfeeding. If engorgement persists after 48 hours of following these recommendations, contact your health care provider or a Science writer. Overall health care recommendations while breastfeeding  Eat 3 healthy meals and 3 snacks every day. Well-nourished mothers who are breastfeeding need an additional 450-500 calories a day. You can meet this requirement by increasing the amount of a balanced diet that you eat.  Drink enough water to keep your urine pale yellow or clear.  Rest often, relax, and continue to take your prenatal vitamins to prevent fatigue, stress, and  low vitamin and mineral levels in your body (nutrient deficiencies).  Do not use any products that contain nicotine or tobacco, such as cigarettes and e-cigarettes. Your baby may be harmed by chemicals from cigarettes that pass into breast milk and exposure to secondhand smoke. If you need help quitting, ask your health care provider.  Avoid alcohol.  Do not use illegal drugs or marijuana.  Talk with your health care provider before taking any medicines. These include over-the-counter and prescription medicines as well as vitamins and herbal supplements. Some medicines that may be harmful to your baby can pass through breast milk.  It is possible to become pregnant while breastfeeding. If birth control is desired, ask your health care provider about options that will be safe while breastfeeding your baby. Where to find more information: [employer]: www.llli.org Contact a health care provider if:  You feel like you want to stop breastfeeding or have become frustrated with breastfeeding.  Your nipples are cracked or bleeding.  Your breasts are red, tender, or warm.  You have: ? Painful breasts or nipples. ? A swollen area on either breast. ? A fever or chills. ? Nausea or vomiting. ? Drainage other than breast milk from your nipples.  Your breasts do not become full before feedings by the fifth day after you give birth.  You feel sad and depressed.  Your baby is: ? Too sleepy to eat well. ? Having trouble sleeping. ? More than 78 week old and wetting fewer than 6 diapers in a 24-hour period. ? Not gaining weight by 83 days of age.  Your baby has fewer than 3 stools in a 24-hour period.  Your baby's skin or the white parts of his or her eyes become yellow. Get help right away if:  Your baby is overly tired (lethargic) and does not want to wake up and feed.  Your baby develops an unexplained fever. Summary  Breastfeeding offers many health benefits for  infant and mothers.  Try to breastfeed your infant when he or she shows early signs of hunger.  Gently tickle or stroke your baby's lips with your finger or nipple to allow the baby to open his or her mouth. Bring the baby to your breast.  Make sure that much of the areola is in your baby's mouth. Offer one side and burp the baby before you offer the other side.  Talk with your health care provider or lactation consultant if you have questions or you face problems as you breastfeed. This information is not intended to replace advice given to you by your health care provider. Make sure you discuss any questions you have with your health care provider. Document Revised: 02/27/2018 Document Reviewed: 01/04/2017 Elsevier Patient Education  [place].

## 2020-04-04 NOTE — Progress Notes (Signed)
ROB-Doing well, no questions or concerns. Education regarding UTI suppression; Rx Macrboid, see orders. Anticipatory guidance regarding course of prenatal care. Reviewed red flag symptoms and when to call. RTC x 2 weeks for ROB or sooner if needed.   Wallene Dales, [facility], [employer]/ Gunnar Bulla, CNM, [facility], [facility]

## 2020-04-18 ENCOUNTER — Ambulatory Visit (INDEPENDENT_AMBULATORY_CARE_PROVIDER_SITE_OTHER): Payer: Medicaid Other | Admitting: Certified Nurse Midwife

## 2020-04-18 ENCOUNTER — Other Ambulatory Visit: Payer: Self-pay

## 2020-04-18 ENCOUNTER — Encounter: Payer: Self-pay | Admitting: Certified Nurse Midwife

## 2020-04-18 VITALS — BP 106/68 | HR 88 | Wt 134.0 lb

## 2020-04-18 DIAGNOSIS — Z3A32 32 weeks gestation of pregnancy: Secondary | ICD-10-CM | POA: Diagnosis not present

## 2020-04-18 LAB — POCT URINALYSIS DIPSTICK OB
Bilirubin, UA: NEGATIVE
Blood, UA: NEGATIVE
Glucose, UA: NEGATIVE
Ketones, UA: NEGATIVE
Leukocytes, UA: NEGATIVE
Nitrite, UA: NEGATIVE
POC,PROTEIN,UA: NEGATIVE
Spec Grav, UA: 1.01 (ref 1.010–1.025)
Urobilinogen, UA: 0.2 E.U./dL
pH, UA: 5 (ref 5.0–8.0)

## 2020-04-18 NOTE — Progress Notes (Signed)
ROB doing well. Feels good movement. Pt left her BP at home but discussed that she plan Breatfeeding, skin to skin, delayed cord clamping, continuous monitoring, Epidural for pain. She will follow up via my chart with any other questions. ROB in 2 wk with Marcelino Duster.   Doreene Burke, CNM

## 2020-04-18 NOTE — Patient Instructions (Signed)

## 2020-05-02 ENCOUNTER — Other Ambulatory Visit: Payer: Self-pay

## 2020-05-02 ENCOUNTER — Ambulatory Visit (INDEPENDENT_AMBULATORY_CARE_PROVIDER_SITE_OTHER): Payer: Medicaid Other | Admitting: Certified Nurse Midwife

## 2020-05-02 ENCOUNTER — Encounter: Payer: Self-pay | Admitting: Certified Nurse Midwife

## 2020-05-02 VITALS — BP 107/72 | HR 106 | Wt 132.7 lb

## 2020-05-02 DIAGNOSIS — Z3A34 34 weeks gestation of pregnancy: Secondary | ICD-10-CM

## 2020-05-02 DIAGNOSIS — Z3403 Encounter for supervision of normal first pregnancy, third trimester: Secondary | ICD-10-CM

## 2020-05-02 DIAGNOSIS — Z87440 Personal history of urinary (tract) infections: Secondary | ICD-10-CM

## 2020-05-02 NOTE — Progress Notes (Signed)
ROB- doing well. Reports "braxton hicks contractions" Taking her Macrobid but not daily as prescribed. Discussed importance of taking Macrobid daily. Encouraged to increase water to 80-100 oz a day and increase protein. Discussed thirty-six (36) week cultures and handouts provided. Anticipatory guidance given and reviewed red flags and when to call the office.  RTC x 2 weeks for ROB or sooner if needed.   Glorious Peach RN [facility] [employer] 05/02/20 2:30 PM

## 2020-05-02 NOTE — Progress Notes (Signed)
Patient comes in today for ROB visit. She had a cramp on left side that woke her up out of her sleep last night. No other concerns today.

## 2020-05-02 NOTE — Patient Instructions (Signed)
Fetal Movement Counts Patient Name: ________________________________________________ Patient Due Date: ____________________ What is a fetal movement count?  A fetal movement count is the number of times that you feel your baby move during a certain amount of time. This may also be called a fetal kick count. A fetal movement count is recommended for every pregnant woman. You may be asked to start counting fetal movements as early as week 28 of your pregnancy. Pay attention to when your baby is most active. You may notice your baby's sleep and wake cycles. You may also notice things that make your baby move more. You should do a fetal movement count:  When your baby is normally most active.  At the same time each day. A good time to count movements is while you are resting, after having something to eat and drink. How do I count fetal movements? 1. Find a quiet, comfortable area. Sit, or lie down on your side. 2. Write down the date, the start time and stop time, and the number of movements that you felt between those two times. Take this information with you to your health care visits. 3. Write down your start time when you feel the first movement. 4. Count kicks, flutters, swishes, rolls, and jabs. You should feel at least 10 movements. 5. You may stop counting after you have felt 10 movements, or if you have been counting for 2 hours. Write down the stop time. 6. If you do not feel 10 movements in 2 hours, contact your health care provider for further instructions. Your health care provider may want to do additional tests to assess your baby's well-being. Contact a health care provider if:  You feel fewer than 10 movements in 2 hours.  Your baby is not moving like he or she usually does. Date: ____________ Start time: ____________ Stop time: ____________ Movements: ____________ Date: ____________ Start time: ____________ Stop time: ____________ Movements: ____________ Date: ____________  Start time: ____________ Stop time: ____________ Movements: ____________ Date: ____________ Start time: ____________ Stop time: ____________ Movements: ____________ Date: ____________ Start time: ____________ Stop time: ____________ Movements: ____________ Date: ____________ Start time: ____________ Stop time: ____________ Movements: ____________ Date: ____________ Start time: ____________ Stop time: ____________ Movements: ____________ Date: ____________ Start time: ____________ Stop time: ____________ Movements: ____________ Date: ____________ Start time: ____________ Stop time: ____________ Movements: ____________ This information is not intended to replace advice given to you by your health care provider. Make sure you discuss any questions you have with your health care provider. Document Revised: 07/23/2019 Document Reviewed: 07/23/2019 Elsevier Patient Education  2020 Elsevier Inc.  

## 2020-05-02 NOTE — Progress Notes (Signed)
I have seen, interviewed, and examined the patient in conjunction with the [employer] Nurse Practitioner student and affirm the diagnosis and management plan.   Gunnar Bulla, CNM [facility], [facility] 05/02/20 3:57 PM

## 2020-05-04 LAB — URINE CULTURE: Organism ID, Bacteria: NO GROWTH

## 2020-05-12 ENCOUNTER — Inpatient Hospital Stay
Admission: EM | Admit: 2020-05-12 | Discharge: 2020-05-14 | DRG: 807 | Disposition: A | Discharge status: Home / Self Care | Payer: Medicaid Other | Attending: Certified Nurse Midwife | Admitting: Certified Nurse Midwife

## 2020-05-12 ENCOUNTER — Other Ambulatory Visit: Payer: Self-pay

## 2020-05-12 ENCOUNTER — Encounter: Payer: Self-pay | Admitting: Obstetrics and Gynecology

## 2020-05-12 DIAGNOSIS — O42019 Preterm premature rupture of membranes, onset of labor within 24 hours of rupture, unspecified trimester: Secondary | ICD-10-CM

## 2020-05-12 DIAGNOSIS — Z8759 Personal history of other complications of pregnancy, childbirth and the puerperium: Secondary | ICD-10-CM | POA: Diagnosis present

## 2020-05-12 DIAGNOSIS — Z20822 Contact with and (suspected) exposure to covid-19: Secondary | ICD-10-CM | POA: Diagnosis present

## 2020-05-12 DIAGNOSIS — Z3A36 36 weeks gestation of pregnancy: Secondary | ICD-10-CM | POA: Diagnosis not present

## 2020-05-12 DIAGNOSIS — Z2839 Other underimmunization status: Secondary | ICD-10-CM

## 2020-05-12 DIAGNOSIS — O09899 Supervision of other high risk pregnancies, unspecified trimester: Secondary | ICD-10-CM | POA: Diagnosis not present

## 2020-05-12 DIAGNOSIS — O42913 Preterm premature rupture of membranes, unspecified as to length of time between rupture and onset of labor, third trimester: Principal | ICD-10-CM | POA: Diagnosis present

## 2020-05-12 DIAGNOSIS — Z283 Underimmunization status: Secondary | ICD-10-CM

## 2020-05-12 LAB — URINE DRUG SCREEN, QUALITATIVE (ARMC ONLY)
Amphetamines, Ur Screen: NOT DETECTED
Barbiturates, Ur Screen: NOT DETECTED
Benzodiazepine, Ur Scrn: NOT DETECTED
Cannabinoid 50 Ng, Ur ~~LOC~~: POSITIVE — AB
Cocaine Metabolite,Ur ~~LOC~~: NOT DETECTED
MDMA (Ecstasy)Ur Screen: NOT DETECTED
Methadone Scn, Ur: NOT DETECTED
Opiate, Ur Screen: NOT DETECTED
Phencyclidine (PCP) Ur S: NOT DETECTED
Tricyclic, Ur Screen: NOT DETECTED

## 2020-05-12 LAB — CBC
HCT: 30.7 % — ABNORMAL LOW (ref 36.0–46.0)
Hemoglobin: 10.5 g/dL — ABNORMAL LOW (ref 12.0–15.0)
MCH: 31.1 pg (ref 26.0–34.0)
MCHC: 34.2 g/dL (ref 30.0–36.0)
MCV: 90.8 fL (ref 80.0–100.0)
Platelets: 190 10*3/uL (ref 150–400)
RBC: 3.38 MIL/uL — ABNORMAL LOW (ref 3.87–5.11)
RDW: 13.1 % (ref 11.5–15.5)
WBC: 17.2 10*3/uL — ABNORMAL HIGH (ref 4.0–10.5)
nRBC: 0 % (ref 0.0–0.2)

## 2020-05-12 LAB — TYPE AND SCREEN
ABO/RH(D): O POS
Antibody Screen: NEGATIVE

## 2020-05-12 LAB — RUPTURE OF MEMBRANE (ROM)PLUS: Rom Plus: POSITIVE

## 2020-05-12 LAB — ABO/RH: ABO/RH(D): O POS

## 2020-05-12 LAB — SARS CORONAVIRUS 2 BY RT PCR (HOSPITAL ORDER, PERFORMED IN ~~LOC~~ HOSPITAL LAB): SARS Coronavirus 2: NEGATIVE

## 2020-05-12 LAB — GROUP B STREP BY PCR: Group B strep by PCR: NEGATIVE

## 2020-05-12 MED ORDER — ONDANSETRON HCL 4 MG/2ML IJ SOLN: 4.0000 mg | Freq: Four times a day (QID) | INTRAMUSCULAR | Status: DC | PRN

## 2020-05-12 MED ORDER — MISOPROSTOL 200 MCG PO TABS
ORAL_TABLET | ORAL | Status: AC
  Administered 2020-05-12: 800 ug
  Filled 2020-05-12: qty 4

## 2020-05-12 MED ORDER — IBUPROFEN 600 MG PO TABS
600.0000 mg | ORAL_TABLET | Freq: Four times a day (QID) | ORAL | Status: DC
  Administered 2020-05-12 – 2020-05-14 (×7): 600 mg via ORAL
  Filled 2020-05-12 (×7): qty 1

## 2020-05-12 MED ORDER — SODIUM CHLORIDE 0.9% FLUSH: 3.0000 mL | INTRAVENOUS | Status: DC | PRN

## 2020-05-12 MED ORDER — IBUPROFEN 600 MG PO TABS
600.0000 mg | ORAL_TABLET | Freq: Four times a day (QID) | ORAL | Status: DC
  Administered 2020-05-12 (×2): 600 mg via ORAL
  Filled 2020-05-12 (×2): qty 1

## 2020-05-12 MED ORDER — SOD CITRATE-CITRIC ACID 500-334 MG/5ML PO SOLN: 30.0000 mL | ORAL | Status: DC | PRN

## 2020-05-12 MED ORDER — SENNOSIDES-DOCUSATE SODIUM 8.6-50 MG PO TABS
2.0000 | ORAL_TABLET | ORAL | Status: DC
  Administered 2020-05-13 – 2020-05-14 (×2): 2 via ORAL
  Filled 2020-05-12 (×2): qty 2

## 2020-05-12 MED ORDER — OXYCODONE HCL 5 MG PO TABS
5.0000 mg | ORAL_TABLET | Freq: Once | ORAL | Status: AC
  Administered 2020-05-12: 5 mg via ORAL

## 2020-05-12 MED ORDER — SODIUM CHLORIDE 0.9 % IV SOLN: 250.0000 mL | INTRAVENOUS | Status: DC | PRN

## 2020-05-12 MED ORDER — DIBUCAINE (PERIANAL) 1 % EX OINT: 1.0000 "application " | TOPICAL_OINTMENT | CUTANEOUS | Status: DC | PRN

## 2020-05-12 MED ORDER — LIDOCAINE HCL (PF) 1 % IJ SOLN: 30.0000 mL | INTRAMUSCULAR | Status: DC | PRN

## 2020-05-12 MED ORDER — COCONUT OIL OIL
1.0000 "application " | TOPICAL_OIL | Status: DC | PRN
  Administered 2020-05-12: 1 via TOPICAL
  Filled 2020-05-12: qty 120

## 2020-05-12 MED ORDER — OXYCODONE HCL 5 MG PO TABS
ORAL_TABLET | ORAL | Status: AC
  Filled 2020-05-12: qty 1

## 2020-05-12 MED ORDER — SIMETHICONE 80 MG PO CHEW: 80.0000 mg | CHEWABLE_TABLET | ORAL | Status: DC | PRN

## 2020-05-12 MED ORDER — WITCH HAZEL-GLYCERIN EX PADS: 1.0000 "application " | MEDICATED_PAD | CUTANEOUS | Status: DC | PRN

## 2020-05-12 MED ORDER — LIDOCAINE HCL (PF) 1 % IJ SOLN
INTRAMUSCULAR | Status: AC
  Filled 2020-05-12: qty 30

## 2020-05-12 MED ORDER — DIPHENHYDRAMINE HCL 25 MG PO CAPS: 25.0000 mg | ORAL_CAPSULE | Freq: Four times a day (QID) | ORAL | Status: DC | PRN

## 2020-05-12 MED ORDER — LACTATED RINGERS IV SOLN: 500.0000 mL | INTRAVENOUS | Status: DC | PRN

## 2020-05-12 MED ORDER — METHYLERGONOVINE MALEATE 0.2 MG/ML IJ SOLN: 0.2000 mg | INTRAMUSCULAR | Status: DC | PRN

## 2020-05-12 MED ORDER — BENZOCAINE-MENTHOL 20-0.5 % EX AERO
1.0000 "application " | INHALATION_SPRAY | CUTANEOUS | Status: DC | PRN
  Administered 2020-05-14: 1 via TOPICAL
  Filled 2020-05-12: qty 56

## 2020-05-12 MED ORDER — OXYTOCIN BOLUS FROM INFUSION: 500.0000 mL | Freq: Once | INTRAVENOUS | Status: DC

## 2020-05-12 MED ORDER — OXYTOCIN 10 UNIT/ML IJ SOLN
INTRAMUSCULAR | Status: AC
  Administered 2020-05-12: 10 [IU]
  Filled 2020-05-12: qty 2

## 2020-05-12 MED ORDER — OXYCODONE-ACETAMINOPHEN 5-325 MG PO TABS: 2.0000 | ORAL_TABLET | ORAL | Status: DC | PRN

## 2020-05-12 MED ORDER — METHYLERGONOVINE MALEATE 0.2 MG PO TABS: 0.2000 mg | ORAL_TABLET | ORAL | Status: DC | PRN

## 2020-05-12 MED ORDER — SODIUM CHLORIDE 0.9 % IV SOLN: 2.0000 g | Freq: Once | INTRAVENOUS | Status: DC

## 2020-05-12 MED ORDER — AMMONIA AROMATIC IN INHA
RESPIRATORY_TRACT | Status: AC
  Filled 2020-05-12: qty 10

## 2020-05-12 MED ORDER — LACTATED RINGERS IV SOLN: INTRAVENOUS | Status: DC

## 2020-05-12 MED ORDER — BUTORPHANOL TARTRATE 1 MG/ML IJ SOLN: 1.0000 mg | INTRAMUSCULAR | Status: DC | PRN

## 2020-05-12 MED ORDER — ACETAMINOPHEN 325 MG PO TABS: 650.0000 mg | ORAL_TABLET | ORAL | Status: DC | PRN

## 2020-05-12 MED ORDER — ACETAMINOPHEN 325 MG PO TABS
650.0000 mg | ORAL_TABLET | ORAL | Status: DC | PRN
  Administered 2020-05-12 (×2): 650 mg via ORAL
  Filled 2020-05-12 (×2): qty 2

## 2020-05-12 MED ORDER — ONDANSETRON HCL 4 MG PO TABS: 4.0000 mg | ORAL_TABLET | ORAL | Status: DC | PRN

## 2020-05-12 MED ORDER — OXYCODONE-ACETAMINOPHEN 5-325 MG PO TABS: 1.0000 | ORAL_TABLET | ORAL | Status: DC | PRN

## 2020-05-12 MED ORDER — PRENATAL MULTIVITAMIN CH
1.0000 | ORAL_TABLET | Freq: Every day | ORAL | Status: DC
  Administered 2020-05-13 – 2020-05-14 (×2): 1 via ORAL
  Filled 2020-05-12 (×2): qty 1

## 2020-05-12 MED ORDER — SODIUM CHLORIDE 0.9% FLUSH: 3.0000 mL | Freq: Two times a day (BID) | INTRAVENOUS | Status: DC

## 2020-05-12 MED ORDER — MISOPROSTOL 200 MCG PO TABS: 800.0000 ug | ORAL_TABLET | Freq: Once | ORAL | Status: AC

## 2020-05-12 MED ORDER — ONDANSETRON HCL 4 MG/2ML IJ SOLN: 4.0000 mg | INTRAMUSCULAR | Status: DC | PRN

## 2020-05-12 MED ORDER — OXYTOCIN 40 UNITS IN NORMAL SALINE INFUSION - SIMPLE MED: 2.5000 [IU]/h | INTRAVENOUS | Status: DC

## 2020-05-12 NOTE — H&P (Signed)
Obstetric History and Physical  Haley Robles is a 19 y.o. G1P0101 with IUP at [redacted]w[redacted]d presenting with leakage of clear fluid and contractions since 0630.  Patient states she has been having regular contractions, minimal vaginal bleeding, ruptured, clear fluid membranes, with active fetal movement.    Denies difficulty breathing or respiratory distress, chest pain, dysuria, and leg pain or swelling.   Prenatal Course  Source of Care: [facility]-initial visit: 9 weeks, total visits: 9   Pregnancy complications or risks: Anxiety and depression, History UTI on Macrobid suppression, History of marijuana use  Prenatal labs and studies:  ABO, Rh: O/Positive/-- 11/08/2023 1430)  Antibody: Negative November 08, 2023 1430)  Rubella: 1.82 November 08, 2023 1430)  Varicella: <135 2023/11/08 1430)  RPR: Non Reactive (04/05 0837)   HBsAg: Negative 2023/11/08 1430)   HIV: Non Reactive November 08, 2023 1430)   JYN:WGNFAOZH/-- (05/27 0810)  1 hr Glucola: 119 (04/05 0837)  Genetic screening: Low risk female (12/08 1543)  Anatomy US: Complete, normal (02/11 1109)  Past Medical History:  Diagnosis Date  . Abdominal pain, recurrent   . Anxiety   . Diarrhea   . Vomiting     Past Surgical History:  Procedure Laterality Date  . NO PAST SURGERIES      OB History  Gravida Para Term Preterm AB Living  1 1   1   1   SAB TAB Ectopic Multiple Live Births        0 1    # Outcome Date GA Lbr Len/2nd Weight Sex Delivery Anes PTL Lv  1 Preterm 05/12/20 [redacted]w[redacted]d    Vag-Spont None  LIV    Social History   Socioeconomic History  . Marital status: Single    Spouse name: Not on file  . Number of children: Not on file  . Years of education: Not on file  . Highest education level: Not on file  Occupational History  . Not on file  Tobacco Use  . Smoking status: Never Smoker  . Smokeless tobacco: Never Used  Substance and Sexual Activity  . Alcohol use: Not Currently  . Drug use: Not Currently  . Sexual activity: Yes    Comment:  undecided  Other Topics Concern  . Not on file  Social History Narrative   6th grade   Social Determinants of Health   Financial Resource Strain:   . Difficulty of Paying Living Expenses:   Food Insecurity:   . Worried About [redacted]w[redacted]d in the Last Year:   . Programme researcher, broadcasting/film/video in the Last Year:   Transportation Needs:   . Barista (Medical):   Freight forwarder Lack of Transportation (Non-Medical):   Physical Activity:   . Days of Exercise per Week:   . Minutes of Exercise per Session:   Stress:   . Feeling of Stress :   Social Connections:   . Frequency of Communication with Friends and Family:   . Frequency of Social Gatherings with Friends and Family:   . Attends Religious Services:   . Active Member of Clubs or Organizations:   . Attends [employer] Meetings:   Banker Marital Status:     Family History  Problem Relation Age of Onset  . Pancreatitis Father   . Cholelithiasis Father   . Ulcers Father   . Ulcers Maternal Grandmother   . Cholelithiasis Maternal Grandfather   . Crohn's disease Cousin     Medications Prior to Admission  Medication Sig Dispense Refill Last Dose  . Prenatal Vit-Fe  Fumarate-FA (PRENATAL MULTIVITAMIN) TABS tablet Take 1 tablet by mouth daily at 12 noon.   Past Week at Unknown time  . Iron-FA-B Cmp-C-Biot-Probiotic (FUSION PLUS) CAPS Take 1 tablet by mouth daily. (Patient not taking: Reported on 05/12/2020) 30 capsule 6 Not Taking at Unknown time  . loperamide (IMODIUM) 2 MG capsule Take 2 capsules (4 mg total) by mouth as needed for diarrhea or loose stools. (Patient not taking: Reported on 05/12/2020) 30 capsule 0 Not Taking at Unknown time  . nitrofurantoin, macrocrystal-monohydrate, (MACROBID) 100 MG capsule Take 1 capsule (100 mg total) by mouth 2 (two) times daily. (Patient not taking: Reported on 05/12/2020) 30 capsule 2 Not Taking at Unknown time  . ondansetron (ZOFRAN ODT) 4 MG disintegrating tablet Take 1 tablet (4 mg total) by  mouth every 6 (six) hours as needed for nausea. (Patient not taking: Reported on 05/12/2020) 20 tablet 0 Not Taking at Unknown time  . oxyCODONE-acetaminophen (PERCOCET/ROXICET) 5-325 MG tablet Take 1 tablet by mouth every 6 (six) hours as needed for severe pain. (Patient not taking: Reported on 05/02/2020) 5 tablet 0     No Known Allergies  Review of Systems: Negative except for what is mentioned in HPI.  Physical Exam:  BP 116/71   Pulse 81   Temp 98 F (36.7 C) (Oral)   Resp 17   Ht 5\' 4"  (1.626 m)   Wt 59.9 kg   LMP 08/26/2019   Breastfeeding Unknown   BMI 22.66 kg/m   GENERAL: Well-developed, well-nourished female in no acute distress.   LUNGS: Clear to auscultation bilaterally.   HEART: Regular rate and rhythm.  ABDOMEN: Soft, nontender, nondistended, gravid.  EXTREMITIES: Nontender, no edema, 2+ distal pulses.  Cervical Exam: Dilation: 10 Dilation Complete Date: 05/12/20 Dilation Complete Time: 0900 Effacement (%): 70, 80 Station: Plus 2 Presentation: Vertex Exam by:: JKL  FHR Category I  Contractions: Every two (2) to four (4) minutes, soft resting tone   Pertinent Labs/Studies:   Results for orders placed or performed during the hospital encounter of 05/12/20 (from the past 24 hour(s))  Group B strep by PCR     Status: None   Collection Time: 05/12/20  8:10 AM   Specimen: Vaginal/Rectal; Genital  Result Value Ref Range   Group B strep by PCR NEGATIVE NEGATIVE  ROM Plus ([facility] only)     Status: None   Collection Time: 05/12/20  8:10 AM  Result Value Ref Range   Rom Plus POSITIVE     Assessment :  Haley Robles is a 19 y.o. G1P0101 at [redacted]w[redacted]d being admitted for preterm premature rupture of membranes, Rh positive, GBS negative, Anxiety and depression, History UTI on Macrobid suppression, History of marijuana use  FHR Category I  Plan:  Admit to birthing suites, see orders.   Labor: Expectant management.    Delivery plan: Hopeful for vaginal  birth.   Room prepared from second stage.   Dr. Marcelline Mates notified of admission and plan of care.    Diona Fanti, CNM [facility], [facility] 05/12/20 10:00 AM

## 2020-05-12 NOTE — Lactation Note (Signed)
This note was copied from a baby's chart. Lactation Consultation Note  Patient Name: Haley Robles [id number] Date: 05/12/2020 Reason for consult: Initial assessment;Primapara;1st time breastfeeding;Late-preterm 34-36.6wks  LC in to see mom and baby DJ [place].). This is mom's first baby, delivered at [redacted]w[redacted]d, SVD- quickly. Blood sugars taken due to protocol/age, stable. Mom tested positive for MJ at a recent OB appointment, educated parents on potential risks for baby with continuing MJ use and breastfeeding.  First feeding was not observed by anyone, but mom reports baby latched easily; no pain discomfort, and remained for about 15 minutes. Transition was able to easily express colostrum and offer to baby prior to first sugar test.  LC reiterated hand expression, provided education and demonstration and encouraged frequent milk removal via baby or hand expression. Breastfeeding basics reviewed: newborn stomach size and feeding patterns, feeding with early hunger cues, benefits of skin to skin, output expectations, normal course of lactation and milk supply and demand. Parents both interactive during visit, acknowledging understanding. LC name/number on whiteboard; encouraged to call out with questions/concerns, and with baby's next feeding.   Maternal Data Formula Feeding for Exclusion: No Has patient been taught Hand Expression?: Yes(Transition RN expressed; LC followed up with education) Does the patient have breastfeeding experience prior to this delivery?: No  Feeding    LATCH Score                   Interventions Interventions: Breast feeding basics reviewed  Lactation Tools Discussed/Used     Consult Status Consult Status: Follow-up Date: 05/12/20 Follow-up type: In-patient    Danford Bad 05/12/2020, 2:13 PM

## 2020-05-12 NOTE — OB Triage Note (Signed)
Patient states she was woken up around 0630 with pain 8/10 cramping in abdomen and hips. Pt complains of LOF and small amount of bleeding. Pad in clear. Pt states baby is moving well. Denies any other needs at this time.

## 2020-05-13 LAB — CBC
HCT: 28.5 % — ABNORMAL LOW (ref 36.0–46.0)
Hemoglobin: 10.2 g/dL — ABNORMAL LOW (ref 12.0–15.0)
MCH: 31.5 pg (ref 26.0–34.0)
MCHC: 35.8 g/dL (ref 30.0–36.0)
MCV: 88 fL (ref 80.0–100.0)
Platelets: 195 10*3/uL (ref 150–400)
RBC: 3.24 MIL/uL — ABNORMAL LOW (ref 3.87–5.11)
RDW: 13.2 % (ref 11.5–15.5)
WBC: 14.3 10*3/uL — ABNORMAL HIGH (ref 4.0–10.5)
nRBC: 0 % (ref 0.0–0.2)

## 2020-05-13 LAB — RPR: RPR Ser Ql: NONREACTIVE

## 2020-05-13 NOTE — Lactation Note (Signed)
This note was copied from a baby's chart. Lactation Consultation Note  Patient Name: Haley Robles [id number] Date: 05/13/2020 Reason for consult: Follow-up assessment Baby sluggish at breast, latches easily but tires quickly, mom set up with Symphony pump and pumped in initiation mode,  Obtained 20 cc colostrum, 10 cc fed to baby , 10 cc labeled and placed in breastmilk refrig in SCN  Maternal Data Formula Feeding for Exclusion: No  Feeding Feeding Type: Bottle Fed - Breast Milk Nipple Type: Slow - flow Paced feed by mom, taken slowly, frequently moving to stimulate, stopping and let baby rest several times, eventually took approx. 10 cc colostrum LATCH Score Latch: Grasps breast easily, tongue down, lips flanged, rhythmical sucking.  Audible Swallowing: Spontaneous and intermittent  Type of Nipple: Everted at rest and after stimulation  Comfort (Breast/Nipple): Soft / non-tender  Hold (Positioning): Assistance needed to correctly position infant at breast and maintain latch.  LATCH Score: 9  Interventions Interventions: Assisted with latch;Skin to skin;Hand express;Adjust position  Lactation Tools Discussed/Used Pump Review: Setup, frequency, and cleaning;Milk Storage Initiated by:: Cay Schillings RNC IBCLC Date initiated:: 05/13/20  Feed every 2-3 hr with cues, if no feed by 3 hrs, stimulate to awaken, attempt breastfeeding, if no or poor feed, given 10 cc EBM by bottle with slow flow nipple. Consult Status Consult Status: Follow-up Date: 05/14/20 Follow-up type: In-patient    Dyann Kief 05/13/2020, 7:44 PM

## 2020-05-13 NOTE — Progress Notes (Signed)
Progress Note - Vaginal Delivery  Haley Robles is a 19 y.o. G1P0101 now PP day 1 s/p Vaginal, Spontaneous .   Subjective:  The patient reports no complaints, up ad lib, voiding and tolerating PO  Objective:  Vital signs in last 24 hours: Temp:  [97.7 F (36.5 C)-98.5 F (36.9 C)] 98 F (36.7 C) (05/28 0902) Pulse Rate:  [54-88] 61 (05/28 0902) Resp:  [16-18] 17 (05/28 0902) BP: (108-124)/(60-84) 109/60 (05/28 0902) SpO2:  [100 %] 100 % (05/28 0902)  Physical Exam:  General: alert, cooperative, appears stated age and fatigued Lochia: appropriate Uterine Fundus: firm @ u-2    Data Review Recent Labs    05/12/20 0924 05/13/20 0619  HGB 10.5* 10.2*  HCT 30.7* 28.5*    Assessment/Plan: Active Problems:   Preterm premature rupture of membranes   Maternal varicella, non-immune   [redacted] weeks gestation of pregnancy   Vaginal delivery   Plan for discharge tomorrow   -- Continue routine PP care.     Doreene Burke, CNM 05/13/2020 9:38 AM

## 2020-05-13 NOTE — Lactation Note (Addendum)
This note was copied from a baby's chart. Lactation Consultation Note  Patient Name: Haley Robles [id number] Date: 05/13/2020 Reason for consult: Follow-up assessment;Late-preterm 34-36.6wks   Maternal Data Formula Feeding for Exclusion: No Has patient been taught Hand Expression?: Yes Does the patient have breastfeeding experience prior to this delivery?: No  Feeding Feeding Type: Breast Fed Baby has tight lower jaw, not opening wide, hx of gagginess with mucous  since birth, able to latch in side lying position on left breast with gentle pressure on jaw and flanging of lips at breast, once latched baby nursed well with stimulation , swallows heard at the breast LATCH Score Latch: Repeated attempts needed to sustain latch, nipple held in mouth throughout feeding, stimulation needed to elicit sucking reflex.  Audible Swallowing: Spontaneous and intermittent  Type of Nipple: Everted at rest and after stimulation  Comfort (Breast/Nipple): Soft / non-tender  Hold (Positioning): Assistance needed to correctly position infant at breast and maintain latch.  LATCH Score: 8  Interventions Interventions: Assisted with latch;Skin to skin;Adjust position;Support pillows  Lactation Tools Discussed/Used WIC Program: Yes   Consult Status Consult Status: PRN Date: 05/13/20 Follow-up type: In-patient    Dyann Kief 05/13/2020, 3:09 PM

## 2020-05-13 NOTE — Clinical Social Work Maternal (Signed)
CLINICAL SOCIAL WORK MATERNAL/CHILD NOTE  Patient Details  Name: Haley Robles MRN: [id number] Date of Birth: [birth year]  Date:  05/13/2020  Clinical Social Worker Initiating Note:  Oleh Genin, LCSW Date/Time: Initiated:  05/13/20/      Child's Name:  Haley Robles. Sharlett Iles   Biological Parents:  Father, Mother   Need for Interpreter:  None   Reason for Referral:  Current Substance Use/Substance Use During Pregnancy    Address:  [place] [address]    Phone number:  [phone] (home)     Additional phone number: [phone]- preferred number per MOB  Household Members/Support Persons (HM/SP):   Household Member/Support Person 1   HM/SP Name Relationship DOB or Age  HM/SP -1 Britta Mccreedy FOB unknown  HM/SP -2        HM/SP -3        HM/SP -4        HM/SP -5        HM/SP -6        HM/SP -7        HM/SP -8          Natural Supports (not living in the home):  Extended Family, Friends, Immediate Family   Professional Supports:     Employment: Unemployed   Type of Work:     Education:  Programmer, systems   Homebound arranged:    Museum/gallery curator Resources:  Medicaid   Other Resources:  [facility]   Cultural/Religious Considerations Which May Impact Care:  None reported.  Strengths:  Ability to meet basic needs , Compliance with medical plan , Home prepared for child , Understanding of illness, Pediatrician chosen   Psychotropic Medications:         Pediatrician:    [facility]  Pediatrician List:   [facility]      Pediatrician Fax Number:    Risk Factors/Current Problems:  Substance Use    Cognitive State:  Able to Concentrate , Alert , Goal Oriented , Insightful    Mood/Affect:  Happy , Calm    CSW Assessment:  CSW received a consult for drug exposed newborn. MOB and Baby with positive UDS for Marijuana.  Cord screen is pending. Per chart review, MOB also has a history of depression and anxiety. CSW spoke with RN Abby prior to meeting with MOB for assessment. RN denied additional concerns at this time.  CSW met with MOB at bedside. Explained HIPPA and MOB elected for FOB Britta Mccreedy) and MOB's Mother Meira Wahba) to remain at bedside during CSW assessment.   Explained CSW's role and reason for referral. MOB was alert, attentive to Baby, and appropriate during assessment.   CSW explained Hospital Drug Screen/CPS Report Policy and the family verbalized understanding. MOB reported she has used Marijuana throughout her pregnancy with her last use being a few days ago. She denied any other substance use during or after her pregnancy. CPS Report made to [place] after assessment, Intake Worker Hildred Alamin. Informed Hildred Alamin that MOB and Randel Books will be discharged home tomorrow per RN. CSW will monitor Cord Screen per policy and report any additional results per Policy.  MOB reported she is feeling good, just sore, post delivery. MOB reported she and FOB will be living in their apartment with Baby at discharge. MOB receives [facility] and reported she will inform  her [facility] Worker of 55 birth. MOB reported she has a new car seat, bassinet, and all other items she needs for Baby. MOB does not drive but stated that she has access to reliable transportation for herself and Baby. MOB plans to use [facility] for [facility]. MOB denied resource needs at this time.  MOB reported having a history of anxiety and depression. She reported she has never taken mental health medications. She has seen a counselor in the past, stated this was several years ago. MOB reported she feels she is coping well emotionally at this time using her coping skills and good support system. MOB denied SI, HI, or DV. She denied mental health support resource needs.   CSW provided education and information sheets regarding PPD and  SIDS. MOB verbalized understanding. CSW encouraged MOB to reach out to her Provider with any questions or needs for support, even after discharge.  CSW encouraged MOB to reach out with any questions or needs.     CSW Plan/Description:  Sudden Infant Death Syndrome (SIDS) Education, Perinatal Mood and Anxiety Disorder (PMADs) Education, Hospital Drug Screen Policy Information, Child Protective Service Report , CSW Awaiting CPS Disposition Plan, CSW Will Continue to Monitor Umbilical Cord Tissue Drug Screen Results and Make Report if Angelene Giovanni, LCSW 05/13/2020, 11:33 AM

## 2020-05-14 MED ORDER — MEDROXYPROGESTERONE ACETATE 150 MG/ML IM SUSP
150.0000 mg | Freq: Once | INTRAMUSCULAR | Status: AC
  Administered 2020-05-14: 150 mg via INTRAMUSCULAR
  Filled 2020-05-14: qty 1

## 2020-05-14 NOTE — Progress Notes (Signed)
Discharge order received from doctor. Varicella vaccine offered. Pt refused varicella vaccine. Depo given at discharge. Reviewed discharge instructions and prescriptions with patient and answered all questions. Follow up appointment instructions given. Patient verbalized understanding. ID bands checked. Patient discharged home with infant via wheelchair by nursing/auxillary.    Inge Rise, RN

## 2020-05-14 NOTE — Final Progress Note (Signed)
Discharge Day SOAP Note:  Progress Note - Vaginal Delivery  Haley Robles is a 19 y.o. G1P0101 now PP day 2 s/p Vaginal, Spontaneous . Delivery was uncomplicated  Subjective  The patient has the following complaints: has no unusual complaints  Pain is controlled with current medications.   Patient is urinating without difficulty.  She is ambulating well.     Objective  Vital signs: BP 111/71 (BP Location: Right Arm)   Pulse 94   Temp 98.4 F (36.9 C)   Resp 18   Ht 5\' 4"  (1.626 m)   Wt 59.9 kg   LMP 08/26/2019   SpO2 100%   Breastfeeding Unknown   BMI 22.66 kg/m   Physical Exam: Gen: NAD Fundus Fundal Tone: Firm  Lochia Amount: Small        Data Review Labs: Lab Results  Component Value Date   WBC 14.3 (H) 05/13/2020   HGB 10.2 (L) 05/13/2020   HCT 28.5 (L) 05/13/2020   MCV 88.0 05/13/2020   PLT 195 05/13/2020   CBC Latest Ref Rng & Units 05/13/2020 05/12/2020 03/21/2020  WBC 4.0 - 10.5 K/uL 14.3(H) 17.2(H) 8.4  Hemoglobin 12.0 - 15.0 g/dL 10.2(L) 10.5(L) 10.9(L)  Hematocrit 36.0 - 46.0 % 28.5(L) 30.7(L) 31.7(L)  Platelets 150 - 400 K/uL 195 190 204   O POS Performed at [facility], [address] [place], [place] [address]   Flavia Shipper Score: Flavia Shipper Postnatal Depression Scale Screening Tool 05/13/2020  I have been able to laugh and see the funny side of things. 0  I have looked forward with enjoyment to things. 0  I have blamed myself unnecessarily when things went wrong. 2  I have been anxious or worried for no good reason. 2  I have felt scared or panicky for no good reason. 0  Things have been getting on top of me. 1  I have been so unhappy that I have had difficulty sleeping. 0  I have felt sad or miserable. 0  I have been so unhappy that I have been crying. 0  The thought of harming myself has occurred to me. 0  Edinburgh Postnatal Depression Scale Total 5    Assessment/Plan  Active Problems:   Preterm premature rupture of  membranes   Maternal varicella, non-immune   [redacted] weeks gestation of pregnancy   Vaginal delivery    Plan for discharge today.  Discharge Instructions: Per After Visit Summary. Activity: Advance as tolerated. Pelvic rest for 6 weeks.  Also refer to After Visit Summary Diet: Regular Medications: Allergies as of 05/14/2020   No Known Allergies     Medication List    STOP taking these medications   loperamide 2 MG capsule Commonly known as: IMODIUM   nitrofurantoin (macrocrystal-monohydrate) 100 MG capsule Commonly known as: MACROBID   ondansetron 4 MG disintegrating tablet Commonly known as: Zofran ODT   oxyCODONE-acetaminophen 5-325 MG tablet Commonly known as: PERCOCET/ROXICET     TAKE these medications   Fusion Plus Caps Take 1 tablet by mouth daily.   prenatal multivitamin Tabs tablet Take 1 tablet by mouth daily at 12 noon.      Outpatient follow up: Dani Gobble, CNM 2 wk tele visit, 6 wk ppv Postpartum contraception: Depo injection prior to discharge home  Discharged Condition: good  Discharged to: home  Newborn Data: Disposition:home with mother  Apgars: APGAR (1 MIN): 9   APGAR (5 MINS): 10   APGAR (10 MINS):    Baby Feeding: Bottle and Breast  Doreene Burke, CNM  05/14/2020 8:29 AM

## 2020-05-14 NOTE — Discharge Summary (Signed)
Patient Name: Haley Robles DOB: 07-21-2001 MRN: [id number]                            Discharge Summary  Date of Admission: 05/12/2020 Date of Discharge: 05/14/2020 Delivering Provider: Lucretia Kern MICHELLE   Admitting Diagnosis: Preterm premature rupture of membranes [O42.919] at [redacted]w[redacted]d Secondary diagnosis:  Active Problems:   Preterm premature rupture of membranes   Maternal varicella, non-immune   [redacted] weeks gestation of pregnancy   Vaginal delivery   Mode of Delivery: normal spontaneous vaginal delivery              Discharge diagnosis: Preterm Pregnancy Delivered      Intrapartum Procedures: laceration Asymmetric bilateral labial lacerations hemostatic, unrepaired   Post partum procedures: none  Complications: Asymmetric bilateral labial lacerations hemostatic, unrepaired laceration                     Discharge Day SOAP Note:  Progress Note - Vaginal Delivery  Haley Robles is a 19 y.o. G1P0101 now PP day 2 s/p Vaginal, Spontaneous . Delivery was uncomplicated  Subjective  The patient has the following complaints: has no unusual complaints  Pain is controlled with current medications.   Patient is urinating without difficulty.  She is ambulating well.     Objective  Vital signs: BP 111/71 (BP Location: Right Arm)   Pulse 94   Temp 98.4 F (36.9 C)   Resp 18   Ht 5\' 4"  (1.626 m)   Wt 59.9 kg   LMP 08/26/2019   SpO2 100%   Breastfeeding Unknown   BMI 22.66 kg/m   Physical Exam: Gen: NAD Fundus Fundal Tone: Firm  Lochia Amount: Small        Data Review Labs: Lab Results  Component Value Date   WBC 14.3 (H) 05/13/2020   HGB 10.2 (L) 05/13/2020   HCT 28.5 (L) 05/13/2020   MCV 88.0 05/13/2020   PLT 195 05/13/2020   CBC Latest Ref Rng & Units 05/13/2020 05/12/2020 03/21/2020  WBC 4.0 - 10.5 K/uL 14.3(H) 17.2(H) 8.4  Hemoglobin 12.0 - 15.0 g/dL 10.2(L) 10.5(L) 10.9(L)  Hematocrit 36.0 - 46.0 % 28.5(L) 30.7(L) 31.7(L)  Platelets 150 - 400  K/uL 195 190 204   O POS Performed at [facility], [address] [place], [place] [address]   Flavia Shipper Score: Flavia Shipper Postnatal Depression Scale Screening Tool 05/13/2020  I have been able to laugh and see the funny side of things. 0  I have looked forward with enjoyment to things. 0  I have blamed myself unnecessarily when things went wrong. 2  I have been anxious or worried for no good reason. 2  I have felt scared or panicky for no good reason. 0  Things have been getting on top of me. 1  I have been so unhappy that I have had difficulty sleeping. 0  I have felt sad or miserable. 0  I have been so unhappy that I have been crying. 0  The thought of harming myself has occurred to me. 0  Edinburgh Postnatal Depression Scale Total 5    Assessment/Plan  Active Problems:   Preterm premature rupture of membranes   Maternal varicella, non-immune   [redacted] weeks gestation of pregnancy   Vaginal delivery    Plan for discharge today.  Discharge Instructions: Per After Visit Summary. Activity: Advance as tolerated. Pelvic rest for 6 weeks.  Also refer to After Visit Summary Diet: Regular Medications: Allergies as of 05/14/2020   No Known Allergies     Medication List    STOP taking these medications   loperamide 2 MG capsule Commonly known as: IMODIUM   nitrofurantoin (macrocrystal-monohydrate) 100 MG capsule Commonly known as: MACROBID   ondansetron 4 MG disintegrating tablet Commonly known as: Zofran ODT   oxyCODONE-acetaminophen 5-325 MG tablet Commonly known as: PERCOCET/ROXICET     TAKE these medications   Fusion Plus Caps Take 1 tablet by mouth daily.   prenatal multivitamin Tabs tablet Take 1 tablet by mouth daily at 12 noon.      Outpatient follow up: Serafina Royals, CNM 2 wk tele visit, 6 wk ppv Postpartum contraception: Depo injection prior to discharge home  Discharged Condition: good  Discharged to: home  Newborn  Data: Disposition:home with mother  Apgars: APGAR (1 MIN): 9   APGAR (5 MINS): 10   APGAR (10 MINS):    Baby Feeding: Bottle and Breast  Doreene Burke, CNM  05/14/2020 8:29 AM

## 2020-05-14 NOTE — Discharge Instructions (Signed)

## 2020-05-17 ENCOUNTER — Encounter: Payer: Medicaid Other | Admitting: Certified Nurse Midwife

## 2020-05-20 ENCOUNTER — Telehealth: Payer: Self-pay | Admitting: Lactation Services

## 2020-05-20 NOTE — Telephone Encounter (Signed)
Pt calling because she saw that someone from this no. Had called her, I informed her that her loaned breast pump was due back today, she stated that her mother would return the pump today

## 2020-05-20 NOTE — Telephone Encounter (Signed)
Breast pump was loaned to mom at discharge until mom could get a pump from [facility], I called her to remind her that the pump was due to be returned today to Lactation.  There was no answer and unable to leave voicemail, message states the mailbox was full, cannot take any messages at this time.

## 2020-05-23 ENCOUNTER — Telehealth: Payer: Self-pay

## 2020-05-23 ENCOUNTER — Ambulatory Visit (INDEPENDENT_AMBULATORY_CARE_PROVIDER_SITE_OTHER): Payer: Medicaid Other | Admitting: Certified Nurse Midwife

## 2020-05-23 ENCOUNTER — Encounter: Payer: Self-pay | Admitting: Certified Nurse Midwife

## 2020-05-23 ENCOUNTER — Other Ambulatory Visit: Payer: Self-pay

## 2020-05-23 DIAGNOSIS — Z1331 Encounter for screening for depression: Secondary | ICD-10-CM

## 2020-05-23 NOTE — Progress Notes (Signed)
Virtual Visit via Telephone Note  I connected with Haley Robles on 05/23/20 at  3:45 PM EDT by telephone and verified that I am speaking with the correct person using two identifiers.  Location:  Patient: Haley Robles (home)  Provider: Gunnar Bulla, CNM ([facility], office)   I discussed the limitations, risks, security and privacy concerns of performing an evaluation and management service by telephone and the availability of in person appointments. I also discussed with the patient that there may be a patient responsible charge related to this service. The patient expressed understanding and agreed to proceed.   History of Present Illness:  Patient is two (2) weeks postpartum spontaneous vaginal birth.   Pumping every few hours, approximately six (6) ounces at a time. Feeding from bottle every three (3) to four (4) hours.   Bleeding is moderate to light, red brown in color.   Cramping relief with Tylenol.   Infant is sleeping well, mother wakes one (1) to two (2) times a night for feedings and naps during the day when boyfriend is home.   Feeling great mood wise.   Denies difficulty breathing or respiratory distress, chest pain, abdominal pain, excessive vaginal bleeding, dysuria, and leg pain or swelling.     Observations/Objective:  Depression screen [facility] 2/9 05/23/2020 04/18/2020 03/21/2020 01/04/2020  Decreased Interest 0 0 1 1  Down, Depressed, Hopeless 0 0 1 0  PHQ - 2 Score 0 0 2 1  Altered sleeping 0 0 1 1  Tired, decreased energy 0 3 1 1   Change in appetite 0 0 3 2  Feeling bad or failure about yourself  0 0 1 1  Trouble concentrating 0 0 1 0  Moving slowly or fidgety/restless 0 0 1 0  Suicidal thoughts - 0 0 -  PHQ-9 Score 0 3 10 6   Difficult doing work/chores - Not difficult at all Not difficult at all Not difficult at all    Assessment and Plan:  Postpartum care and examination Lactating mother Depression screen negative  Follow  Up Instructions:  Reviewed red flag symptoms and when to call.   RTC as previously scheduled for postpartum visit or sooner if needed.    I discussed the assessment and treatment plan with the patient. The patient was provided an opportunity to ask questions and all were answered. The patient agreed with the plan and demonstrated an understanding of the instructions.   The patient was advised to call back or seek an in-person evaluation if the symptoms worsen or if the condition fails to improve as anticipated.  I provided 5 minutes of non-face-to-face time during this encounter.   , CNM [facility], [facility] 05/23/20 4:48 PM

## 2020-05-23 NOTE — Telephone Encounter (Signed)
Attempted to contact patient for televisit. Mailbox is full- unable to leave a message. Mychart message sent to please contact office.

## 2020-05-23 NOTE — Progress Notes (Signed)
Received transferred call from [place] for a televisit. DOB as identifier. Patient is pumping. Her flow is medium. PhQ9=0. Call transferred to [facility] CNM.

## 2020-06-23 ENCOUNTER — Encounter: Payer: Self-pay | Admitting: Certified Nurse Midwife

## 2020-06-23 ENCOUNTER — Ambulatory Visit (INDEPENDENT_AMBULATORY_CARE_PROVIDER_SITE_OTHER): Payer: Medicaid Other | Admitting: Certified Nurse Midwife

## 2020-06-23 ENCOUNTER — Other Ambulatory Visit: Payer: Self-pay

## 2020-06-23 VITALS — BP 107/59 | HR 85 | Ht 64.0 in | Wt 122.4 lb

## 2020-06-23 DIAGNOSIS — Z1331 Encounter for screening for depression: Secondary | ICD-10-CM | POA: Diagnosis not present

## 2020-06-23 NOTE — Patient Instructions (Signed)
Preventive Care 18-21 Years Old, Female Preventive care refers to lifestyle choices and visits with your health care provider that can promote health and wellness. At this stage in your life, you may start seeing a primary care physician instead of a pediatrician. Your health care is now your responsibility. Preventive care for young adults includes:  A yearly physical exam. This is also called an annual wellness visit.  Regular dental and eye exams.  Immunizations.  Screening for certain conditions.  Healthy lifestyle choices, such as diet and exercise. What can I expect for my preventive care visit? Physical exam Your health care provider may check:  Height and weight. These may be used to calculate body mass index (BMI), which is a measurement that tells if you are at a healthy weight.  Heart rate and blood pressure.  Body temperature. Counseling Your health care provider may ask you questions about:  Past medical problems and family medical history.  Alcohol, tobacco, and drug use.  Home and relationship well-being.  Access to firearms.  Emotional well-being.  Diet, exercise, and sleep habits.  Sexual activity and sexual health.  Method of birth control.  Menstrual cycle.  Pregnancy history. What immunizations do I need?  Influenza (flu) vaccine  This is recommended every year. Tetanus, diphtheria, and pertussis (Tdap) vaccine  You may need a Td booster every 10 years. Varicella (chickenpox) vaccine  You may need this vaccine if you have not already been vaccinated. Human papillomavirus (HPV) vaccine  If recommended by your health care provider, you may need three doses over 6 months. Measles, mumps, and rubella (MMR) vaccine  You may need at least one dose of MMR. You may also need a second dose. Meningococcal conjugate (MenACWY) vaccine  One dose is recommended if you are 19-21 years old and a first-year college student living in a residence hall,  or if you have one of several medical conditions. You may also need additional booster doses. Pneumococcal conjugate (PCV13) vaccine  You may need this if you have certain conditions and were not previously vaccinated. Pneumococcal polysaccharide (PPSV23) vaccine  You may need one or two doses if you smoke cigarettes or if you have certain conditions. Hepatitis A vaccine  You may need this if you have certain conditions or if you travel or work in places where you may be exposed to hepatitis A. Hepatitis B vaccine  You may need this if you have certain conditions or if you travel or work in places where you may be exposed to hepatitis B. Haemophilus influenzae type b (Hib) vaccine  You may need this if you have certain risk factors. You may receive vaccines as individual doses or as more than one vaccine together in one shot (combination vaccines). Talk with your health care provider about the risks and benefits of combination vaccines. What tests do I need? Blood tests  Lipid and cholesterol levels. These may be checked every 5 years starting at age 20.  Hepatitis C test.  Hepatitis B test. Screening  Pelvic exam and Pap test. This may be done every 3 years starting at age 21.  Sexually transmitted disease (STD) testing, if you are at risk.  BRCA-related cancer screening. This may be done if you have a family history of breast, ovarian, tubal, or peritoneal cancers. Other tests  Tuberculosis skin test.  Vision and hearing tests.  Skin exam.  Breast exam. Follow these instructions at home: Eating and drinking   Eat a diet that includes fresh fruits and   vegetables, whole grains, lean protein, and low-fat dairy products.  Drink enough fluid to keep your urine pale yellow.  Do not drink alcohol if: ? Your health care provider tells you not to drink. ? You are pregnant, may be pregnant, or are planning to become pregnant. ? You are under the legal drinking age. In the  [place] the legal drinking age is 36.  If you drink alcohol: ? Limit how much you have to 0-1 drink a day. ? Be aware of how much alcohol is in your drink. In the [place] one drink equals one 12 oz bottle of beer (355 mL), one 5 oz glass of wine (148 mL), or one 1 oz glass of hard liquor (44 mL). Lifestyle  Take daily care of your teeth and gums.  Stay active. Exercise at least 30 minutes 5 or more days of the week.  Do not use any products that contain nicotine or tobacco, such as cigarettes, e-cigarettes, and chewing tobacco. If you need help quitting, ask your health care provider.  Do not use drugs.  If you are sexually active, practice safe sex. Use a condom or other form of birth control (contraception) in order to prevent pregnancy and STIs (sexually transmitted infections). If you plan to become pregnant, see your health care provider for a pre-conception visit.  Find healthy ways to cope with stress, such as: ? Meditation, yoga, or listening to music. ? Journaling. ? Talking to a trusted person. ? Spending time with friends and family. Safety  Always wear your seat belt while driving or riding in a vehicle.  Do not drive if you have been drinking alcohol. Do not ride with someone who has been drinking.  Do not drive when you are tired or distracted. Do not text while driving.  Wear a helmet and other protective equipment during sports activities.  If you have firearms in your house, make sure you follow all gun safety procedures.  Seek help if you have been bullied, physically abused, or sexually abused.  Use the Internet responsibly to avoid dangers such as online bullying and online sex predators. What's next?  Go to your health care provider once a year for a well check visit.  Ask your health care provider how often you should have your eyes and teeth checked.  Stay up to date on all vaccines. This information is not intended to replace advice given to you by  your health care provider. Make sure you discuss any questions you have with your health care provider. Document Revised: 11/27/2018 Document Reviewed: 11/27/2018 Elsevier Patient Education  2020 [employer].

## 2020-06-23 NOTE — Progress Notes (Signed)
Subjective:    Haley Robles is a 19 y.o. G29P0101 Caucasian female who presents for a postpartum visit. She is 6 weeks postpartum following a spontaneous vaginal delivery at 36+2 gestational weeks. Anesthesia: none. I have fully reviewed the prenatal and intrapartum course.  Postpartum course has been unvomplicated. Baby's course has been uncomplicated. Baby is feeding by formula.   Bleeding no bleeding. Bowel function is normal. Bladder function is normal.   Patient is sexually active. Contraception method is Depo-Provera injections, last dose 05/14/2020.   Postpartum depression screening: negative. Score 0.    Last pap N/A, due 08/2020.   Denies difficulty breathing or respiratory distress, chest pain, abdominal pain, excessive vaginal bleeding, dysuria, and leg pain or swelling.   The following portions of the patient's history were reviewed and updated as appropriate: allergies, current medications, past medical history, past surgical history and problem list.  Review of Systems  Pertinent items are noted in HPI.   Objective:   BP (!) 107/59   Pulse 85   Ht 5\' 4"  (1.626 m)   Wt 122 lb 6 oz (55.5 kg)   Breastfeeding No   BMI 21.01 kg/m   General:  alert, cooperative and no distress   Breasts:  deferred, no complaints  Lungs: clear to auscultation bilaterally  Heart:  regular rate and rhythm  Abdomen: soft, nontender   Vulva: normal  Vagina: normal vagina  Cervix:  closed  Corpus: Well-involuted  Adnexa:  Non-palpable       Depression screen [facility] 2/9 06/23/2020 05/23/2020 04/18/2020 03/21/2020 01/04/2020  Decreased Interest 0 0 0 1 1  Down, Depressed, Hopeless 0 0 0 1 0  PHQ - 2 Score 0 0 0 2 1  Altered sleeping 0 0 0 1 1  Tired, decreased energy 0 0 3 1 1   Change in appetite 0 0 0 3 2  Feeling bad or failure about yourself  0 0 0 1 1  Trouble concentrating 0 0 0 1 0  Moving slowly or fidgety/restless 0 0 0 1 0  Suicidal thoughts 0 - 0 0 -  PHQ-9 Score 0 0 3 10 6    Difficult doing work/chores - - Not difficult at all Not difficult at all Not difficult at all    Assessment:   Postpartum exam Six (6) wks s/p spontaneous vaginal birth Formula feeding Depression screening Contraception counseling   Plan:   Encouraged routine health maintenance techniques.   Reviewed red flag symptoms and when to call.   RTC for next depo injection.   Follow up in: 3 months for ANNUAL EXAM and PAP or earlier if needed.   01/06/2020, CNM [facility], [facility] 06/23/20 2:34 PM

## 2020-07-13 ENCOUNTER — Telehealth: Payer: Self-pay | Admitting: Certified Nurse Midwife

## 2020-07-13 NOTE — Telephone Encounter (Signed)
Called pt couldn't lmtrc for depo injection appt, move appt to 8/12

## 2020-07-28 ENCOUNTER — Ambulatory Visit (INDEPENDENT_AMBULATORY_CARE_PROVIDER_SITE_OTHER): Payer: Medicaid Other | Admitting: Certified Nurse Midwife

## 2020-07-28 DIAGNOSIS — Z3042 Encounter for surveillance of injectable contraceptive: Secondary | ICD-10-CM | POA: Diagnosis not present

## 2020-07-28 LAB — POCT URINE PREGNANCY: Preg Test, Ur: NEGATIVE

## 2020-07-28 MED ORDER — MEDROXYPROGESTERONE ACETATE 150 MG/ML IM SUSP
150.0000 mg | Freq: Once | INTRAMUSCULAR | Status: AC
  Administered 2020-07-28: 150 mg via INTRAMUSCULAR

## 2020-07-28 MED ORDER — MEDROXYPROGESTERONE ACETATE 150 MG/ML IM SUSP: 150.0000 mg | INTRAMUSCULAR | 3 refills | Status: DC

## 2020-07-28 NOTE — Progress Notes (Signed)
Date last pap: underage Last Depo-Provera: first injection UPT-NEG.  Side Effects if any: none Serum HCG indicated? n/a Depo-Provera 150 mg IM given by: Jari Pigg, LPN Next appointment due Oct 28-Oct 27, 2020.  Used office depo provera.

## 2020-08-01 ENCOUNTER — Ambulatory Visit: Payer: Medicaid Other

## 2020-08-29 ENCOUNTER — Other Ambulatory Visit: Payer: Self-pay

## 2020-08-29 ENCOUNTER — Encounter: Payer: Self-pay | Admitting: Certified Nurse Midwife

## 2020-08-29 ENCOUNTER — Ambulatory Visit (INDEPENDENT_AMBULATORY_CARE_PROVIDER_SITE_OTHER): Payer: Medicaid Other | Admitting: Certified Nurse Midwife

## 2020-08-29 VITALS — BP 80/62 | Ht 64.0 in | Wt 119.1 lb

## 2020-08-29 DIAGNOSIS — N921 Excessive and frequent menstruation with irregular cycle: Secondary | ICD-10-CM | POA: Diagnosis not present

## 2020-08-29 NOTE — Progress Notes (Signed)
GYN ENCOUNTER NOTE  Subjective:       Haley Robles is a 19 y.o. G50P0101 female is here for gynecologic evaluation of the following issues:  1. Breakthrough bleeding on depo-provera; received first dose two (2) days after spontaneous vaginal birth and second dose on 07/28/2020    Denies difficulty breathing or respiratory distress, chest pain, abdominal pain, excessive vaginal bleeding, dysuria, and leg pain or swelling.    Gynecologic History  No LMP recorded. Patient has had an injection. Period Pattern: (!) Irregular Menstrual Flow: Moderate Menstrual Control: Maxi pad Menstrual Control Change Freq (Hours): Two (2) to three (3) Dysmenorrhea: (!) Mild Dysmenorrhea Symptoms: Headache, Cramping  Contraception: Depo-Provera injections  Last Pap: due  Obstetric History  OB History  Gravida Para Term Preterm AB Living  1 1   1   1   SAB TAB Ectopic Multiple Live Births        0 1    # Outcome Date GA Lbr Len/2nd Weight Sex Delivery Anes PTL Lv  1 Preterm 05/12/20 [redacted]w[redacted]d 02:30 / 00:09 6 lb 2.4 oz (2.79 kg) M Vag-Spont None  LIV    Past Medical History:  Diagnosis Date  . Abdominal pain, recurrent   . Anxiety   . Diarrhea   . Vomiting     Past Surgical History:  Procedure Laterality Date  . NO PAST SURGERIES      Current Outpatient Medications on File Prior to Visit  Medication Sig Dispense Refill  . medroxyPROGESTERone (DEPO-PROVERA) 150 MG/ML injection Inject 1 mL (150 mg total) into the muscle every 3 (three) months. 1 mL 3   No current facility-administered medications on file prior to visit.    No Known Allergies  Social History   Socioeconomic History  . Marital status: Single    Spouse name: Not on file  . Number of children: Not on file  . Years of education: Not on file  . Highest education level: Not on file  Occupational History  . Not on file  Tobacco Use  . Smoking status: Never Smoker  . Smokeless tobacco: Never Used  Vaping Use  . Vaping  Use: Never used  Substance and Sexual Activity  . Alcohol use: Yes    Comment: occasionally  . Drug use: Not Currently  . Sexual activity: Yes    Birth control/protection: Injection    Comment: undecided  Other Topics Concern  . Not on file  Social History Narrative      Social Determinants of Health   Financial Resource Strain:   . Difficulty of Paying Living Expenses: Not on file  Food Insecurity:   . Worried About [redacted]w[redacted]d in the Last Year: Not on file  . Ran Out of Food in the Last Year: Not on file  Transportation Needs:   . Lack of Transportation (Medical): Not on file  . Lack of Transportation (Non-Medical): Not on file  Physical Activity:   . Days of Exercise per Week: Not on file  . Minutes of Exercise per Session: Not on file  Stress:   . Feeling of Stress : Not on file  Social Connections:   . Frequency of Communication with Friends and Family: Not on file  . Frequency of Social Gatherings with Friends and Family: Not on file  . Attends Religious Services: Not on file  . Active Member of Clubs or Organizations: Not on file  . Attends Programme researcher, broadcasting/film/video Meetings: Not on file  . Marital Status: Not on  file  Intimate Partner Violence:   . Fear of Current or Ex-Partner: Not on file  . Emotionally Abused: Not on file  . Physically Abused: Not on file  . Sexually Abused: Not on file    Family History  Problem Relation Age of Onset  . Pancreatitis Father   . Cholelithiasis Father   . Ulcers Father   . Ulcers Maternal Grandmother   . Cholelithiasis Maternal Grandfather   . Crohn's disease Cousin     The following portions of the patient's history were reviewed and updated as appropriate: allergies, current medications, past family history, past medical history, past social history, past surgical history and problem list.  Review of Systems  ROS negative except as noted above. Information obtained from patient.   Objective:   BP (!) 80/62    Ht 5\' 4"  (1.626 m)   Wt 119 lb 1.6 oz (54 kg)   Breastfeeding No   BMI 20.44 kg/m    CONSTITUTIONAL: Well-developed, well-nourished female in no acute distress.   PELVIC EXAM: Declined by patient.   Assessment:   1. Breakthrough bleeding on depo provera  - CBC - Estradiol - FSH/LH - TSH   Plan:   Labs today, see orders.   Discussed bleeding management options. Samples of Balcoltra given.   Reviewed red flag symptoms and when to call.   RTC as previously scheduled or sooner if needed.    A total of 15 minutes were spent face-to-face with the patient during this encounter and over half of that time dealt with counseling and coordination of care.

## 2020-08-29 NOTE — Patient Instructions (Signed)
Preventive Care 20-21 Years Old, Female Preventive care refers to visits with your health care provider and lifestyle choices that can promote health and wellness. This includes:  A yearly physical exam. This may also be called an annual well check.  Regular dental visits and eye exams.  Immunizations.  Screening for certain conditions.  Healthy lifestyle choices, such as eating a healthy diet, getting regular exercise, not using drugs or products that contain nicotine and tobacco, and limiting alcohol use. What can I expect for my preventive care visit? Physical exam Your health care provider will check your:  Height and weight. This may be used to calculate body mass index (BMI), which tells if you are at a healthy weight.  Heart rate and blood pressure.  Skin for abnormal spots. Counseling Your health care provider may ask you questions about your:  Alcohol, tobacco, and drug use.  Emotional well-being.  Home and relationship well-being.  Sexual activity.  Eating habits.  Work and work Statistician.  Method of birth control.  Menstrual cycle.  Pregnancy history. What immunizations do I need?  Influenza (flu) vaccine  This is recommended every year. Tetanus, diphtheria, and pertussis (Tdap) vaccine  You may need a Td booster every 10 years. Varicella (chickenpox) vaccine  You may need this if you have not been vaccinated. Human papillomavirus (HPV) vaccine  If recommended by your health care provider, you may need three doses over 6 months. Measles, mumps, and rubella (MMR) vaccine  You may need at least one dose of MMR. You may also need a second dose. Meningococcal conjugate (MenACWY) vaccine  One dose is recommended if you are age 75-21 years and a first-year college student living in a residence hall, or if you have one of several medical conditions. You may also need additional booster doses. Pneumococcal conjugate (PCV13) vaccine  You may need  this if you have certain conditions and were not previously vaccinated. Pneumococcal polysaccharide (PPSV23) vaccine  You may need one or two doses if you smoke cigarettes or if you have certain conditions. Hepatitis A vaccine  You may need this if you have certain conditions or if you travel or work in places where you may be exposed to hepatitis A. Hepatitis B vaccine  You may need this if you have certain conditions or if you travel or work in places where you may be exposed to hepatitis B. Haemophilus influenzae type b (Hib) vaccine  You may need this if you have certain conditions. You may receive vaccines as individual doses or as more than one vaccine together in one shot (combination vaccines). Talk with your health care provider about the risks and benefits of combination vaccines. What tests do I need?  Blood tests  Lipid and cholesterol levels. These may be checked every 5 years starting at age 33.  Hepatitis C test.  Hepatitis B test. Screening  Diabetes screening. This is done by checking your blood sugar (glucose) after you have not eaten for a while (fasting).  Sexually transmitted disease (STD) testing.  BRCA-related cancer screening. This may be done if you have a family history of breast, ovarian, tubal, or peritoneal cancers.  Pelvic exam and Pap test. This may be done every 3 years starting at age 76. Starting at age 102, this may be done every 5 years if you have a Pap test in combination with an HPV test. Talk with your health care provider about your test results, treatment options, and if necessary, the need for more tests.  Follow these instructions at home: Eating and drinking   Eat a diet that includes fresh fruits and vegetables, whole grains, lean protein, and low-fat dairy.  Take vitamin and mineral supplements as recommended by your health care provider.  Do not drink alcohol if: ? Your health care provider tells you not to drink. ? You are  pregnant, may be pregnant, or are planning to become pregnant.  If you drink alcohol: ? Limit how much you have to 0-1 drink a day. ? Be aware of how much alcohol is in your drink. In the [place] one drink equals one 12 oz bottle of beer (355 mL), one 5 oz glass of wine (148 mL), or one 1 oz glass of hard liquor (44 mL). Lifestyle  Take daily care of your teeth and gums.  Stay active. Exercise for at least 30 minutes on 5 or more days each week.  Do not use any products that contain nicotine or tobacco, such as cigarettes, e-cigarettes, and chewing tobacco. If you need help quitting, ask your health care provider.  If you are sexually active, practice safe sex. Use a condom or other form of birth control (contraception) in order to prevent pregnancy and STIs (sexually transmitted infections). If you plan to become pregnant, see your health care provider for a preconception visit. What's next?  Visit your health care provider once a year for a well check visit.  Ask your health care provider how often you should have your eyes and teeth checked.  Stay up to date on all vaccines. This information is not intended to replace advice given to you by your health care provider. Make sure you discuss any questions you have with your health care provider. Document Revised: 08/14/2018 Document Reviewed: 08/14/2018 Elsevier Patient Education  2020 [employer].   Hormonal Contraception Information Hormonal contraception is a type of birth control that uses hormones to prevent pregnancy. It usually involves a combination of the hormones estrogen and progesterone or only the hormone progesterone. Hormonal contraception works in these ways:  It thickens the mucus in the cervix, making it harder for sperm to enter the uterus.  It changes the lining of the uterus, making it harder for an egg to implant.  It may stop the ovaries from releasing eggs (ovulation). Some women who take hormonal  contraceptives that contain only progesterone may continue to ovulate. Hormonal contraception cannot prevent sexually transmitted infections (STIs). Pregnancy may still occur. Estrogen and progesterone contraceptives Contraceptives that use a combination of estrogen and progesterone are available in these forms:  Pill. Pills come in different combinations of hormones. They must be taken at the same time each day. Pills can affect your period, causing you to get your period once every three months or not at all.  Patch. The patch must be worn on the lower abdomen for three weeks and then removed on the fourth.  Vaginal ring. The ring is placed in the vagina and left there for three weeks. It is then removed for one week. Progesterone contraceptives Contraceptives that use progesterone only are available in these forms:  Pill. Pills should be taken every day of the cycle.  Intrauterine device (IUD). This device is inserted into the uterus and removed or replaced every five years or sooner.  Implant. Plastic rods are placed under the skin of the upper arm. They are removed or replaced every three years or sooner.  Injection. The injection is given once every 90 days. What are the side effects? The  side effects of estrogen and progesterone contraceptives include:  Nausea.  Headaches.  Breast tenderness.  Bleeding or spotting between menstrual cycles.  High blood pressure (rare).  Strokes, heart attacks, or blood clots (rare) Side effects of progesterone-only contraceptives include:  Nausea.  Headaches.  Breast tenderness.  Unpredictable menstrual bleeding.  High blood pressure (rare). Talk to your health care provider about what side effects may affect you. Where to find more information  Ask your health care provider for more information and resources about hormonal contraception.  [place] Department of Health and Programmer, systems on Women's Health:  [place] Questions to ask:  What type of hormonal contraception is right for me?  How long should I plan to use hormonal contraception?  What are the side effects of the hormonal contraception method I choose?  How can I prevent STIs while using hormonal contraception? Contact a health care provider if:  You start taking hormonal contraceptives and you develop persistent or severe side effects. Summary  Estrogen and progesterone are hormones used in many forms of birth control.  Talk to your health care provider about what side effects may affect you.  Hormonal contraception cannot prevent sexually transmitted infections (STIs).  Ask your health care provider for more information and resources about hormonal contraception. This information is not intended to replace advice given to you by your health care provider. Make sure you discuss any questions you have with your health care provider. Document Revised: 03/30/2019 Document Reviewed: 11/02/2016 Elsevier Patient Education  [place].   Ethinyl Estradiol; Levonorgestrel; Ferrous bisglycinate oral tablets What is this medicine? ETHINYL ESTRADIOL; LEVONORGESTREL; FERROUS BISGLYCINATE (ETH in il es tra DYE ole; LEE voh nor jes trel; FER [place] bis GLY sin ate) is an oral contraceptive (birth control pill). This product combines two types of female hormones, an estrogen and a progestin. It is used to prevent ovulation and pregnancy. This medicine may be used for other purposes; ask your health care provider or pharmacist if you have questions. COMMON BRAND NAME(S): Balcoltra What should I tell my health care provider before I take this medicine? They need to know if you have any of these conditions:  abnormal vaginal bleeding  blood vessel disease  breast, cervical, endometrial, ovarian, liver, or uterine cancer  diabetes  gallbladder disease  heart disease or recent heart attack  high blood  pressure  high cholesterol  history of blood clots  kidney disease  liver disease  migraine headaches  smoke tobacco  stroke  systemic lupus erythematosus (SLE)  an unusual or allergic reaction to estrogens, progestins, other medicines, foods, dyes, or preservatives  pregnant or trying to get pregnant  breast-feeding How should I use this medicine? Take this medicine by mouth. To reduce nausea, this medicine may be taken with food. Follow the directions on the prescription label. Take this medicine at the same time each day and in the order directed on the package. Do not take your medicine more often than directed. A patient package insert for the product will be given with each prescription and refill. Read this sheet carefully each time. The sheet may change frequently. Contact your pediatrician regarding the use of this medicine in children. Special care may be needed. This medicine has been used in female children who have started having menstrual periods. Overdosage: If you think you have taken too much of this medicine contact a poison control center or emergency room at once. NOTE: This medicine is only for you. Do not share  this medicine with others. What if I miss a dose? If you miss a dose, refer to the patient information sheet you received with your medicine for direction. If you miss more than one pill, this medicine may not be as effective and you may need to use another form of birth control. What may interact with this medicine? Do not take this medicine with the following medication:  dasabuvir; ombitasvir; paritaprevir; ritonavir  ombitasvir; paritaprevir; ritonavir This medicine may also interact with the following medications:  acetaminophen  antibiotics or medicines for infections, especially rifampin, rifabutin, rifapentine, and griseofulvin, and possibly penicillins or tetracyclines  aprepitant  ascorbic acid (vitamin  C)  atorvastatin  barbiturate medicines, such as phenobarbital  bosentan  caffeine  carbamazepine  clofibrate  cyclosporine  dantrolene  doxercalciferol  felbamate  grapefruit juice  hydrocortisone  medicines for anxiety or sleeping problems, such as diazepam or temazepam  medicines for diabetes, including pioglitazone  mineral oil  modafinil  mycophenolate  nefazodone  oxcarbazepine  phenytoin  prednisolone  ritonavir or other medicines for HIV infection or AIDS  rosuvastatin  selegiline  soy isoflavones supplements  St. John's wort  tamoxifen or raloxifene  theophylline  thyroid hormones  topiramate  warfarin This list may not describe all possible interactions. Give your health care provider a list of all the medicines, herbs, non-prescription drugs, or dietary supplements you use. Also tell them if you smoke, drink alcohol, or use illegal drugs. Some items may interact with your medicine. What should I watch for while using this medicine? Visit your doctor or health care professional for regular checks on your progress. You will need a regular breast and pelvic exam and Pap smear while on this medicine. Use an additional method of contraception during the first cycle that you take these tablets. If you have any reason to think you are pregnant, stop taking this medicine right away and contact your doctor or health care professional. If you are taking this medicine for hormone related problems, it may take several cycles of use to see improvement in your condition. Smoking increases the risk of getting a blood clot or having a stroke while you are taking birth control pills, especially if you are more than 21 years old. You are strongly advised not to smoke. This medicine can make your body retain fluid, making your fingers, hands, or ankles swell. Your blood pressure can go up. Contact your doctor or health care professional if you feel you  are retaining fluid. This medicine can make you more sensitive to the sun. Keep out of the sun. If you cannot avoid being in the sun, wear protective clothing and use sunscreen. Do not use sun lamps or tanning beds/booths. If you wear contact lenses and notice visual changes, or if the lenses begin to feel uncomfortable, consult your eye care specialist. In some women, tenderness, swelling, or minor bleeding of the gums may occur. Notify your dentist if this happens. Brushing and flossing your teeth regularly may help limit this. See your dentist regularly and inform your dentist of the medicines you are taking. If you are going to have elective surgery, you may need to stop taking this medicine before the surgery. Consult your health care professional for advice. This medicine does not protect you against HIV infection (AIDS) or any other sexually transmitted diseases. What side effects may I notice from receiving this medicine? Side effects that you should report to your doctor or health care professional as soon as possible:  allergic reactions like skin rash, itching or hives, swelling of the face, lips, or tongue  breast tissue changes or discharge  changes in vaginal bleeding during your period or between your periods  changes in vision  chest pain  confusion  coughing up blood  dizziness  feeling faint or lightheaded  headaches or migraines  leg, arm or groin pain  loss of balance or coordination  severe or sudden headaches  stomach pain (severe)  sudden shortness of breath  sudden numbness or weakness of the face, arm or leg  symptoms of vaginal infection like itching, irritation or unusual discharge  tenderness in the upper abdomen  trouble speaking or understanding  vomiting  yellowing of the eyes or skin Side effects that usually do not require medical attention (report these to your doctor or health care professional if they continue or are  bothersome):  breakthrough bleeding and spotting that continues beyond the 3 initial cycles of pills  breast tenderness  mood changes, anxiety, depression, frustration, anger, or emotional outbursts  increased sensitivity to sun or ultraviolet light  nausea  skin rash, acne, or brown spots on the skin  weight gain (slight) This list may not describe all possible side effects. Call your doctor for medical advice about side effects. You may report side effects to FDA at 1-800-FDA-1088. Where should I keep my medicine? Keep out of the reach of children. Store at room temperature between 15 and 30 degrees C (59 and 86 degrees F). Throw away any unused medicine after the expiration date. NOTE: This sheet is a summary. It may not cover all possible information. If you have questions about this medicine, talk to your doctor, pharmacist, or health care provider.  2020 Elsevier/Gold Standard (2017-01-07 15:12:39)

## 2020-08-30 LAB — CBC
Hematocrit: 36.8 % (ref 34.0–46.6)
Hemoglobin: 12.1 g/dL (ref 11.1–15.9)
MCH: 28.9 pg (ref 26.6–33.0)
MCHC: 32.9 g/dL (ref 31.5–35.7)
MCV: 88 fL (ref 79–97)
Platelets: 230 10*3/uL (ref 150–450)
RBC: 4.19 x10E6/uL (ref 3.77–5.28)
RDW: 12.9 % (ref 11.7–15.4)
WBC: 7.3 10*3/uL (ref 3.4–10.8)

## 2020-08-30 LAB — FSH/LH
FSH: 6.5 m[IU]/mL
LH: 6.6 m[IU]/mL

## 2020-08-30 LAB — ESTRADIOL: Estradiol: 19.9 pg/mL

## 2020-08-30 LAB — TSH: TSH: 0.736 u[IU]/mL (ref 0.450–4.500)

## 2020-10-14 ENCOUNTER — Encounter: Payer: Medicaid Other | Admitting: Certified Nurse Midwife

## 2020-10-31 ENCOUNTER — Encounter: Payer: Medicaid Other | Admitting: Certified Nurse Midwife

## 2020-11-22 ENCOUNTER — Other Ambulatory Visit: Payer: Self-pay

## 2020-12-12 ENCOUNTER — Encounter: Payer: Self-pay | Admitting: Certified Nurse Midwife

## 2020-12-12 ENCOUNTER — Ambulatory Visit (INDEPENDENT_AMBULATORY_CARE_PROVIDER_SITE_OTHER): Payer: Medicaid Other | Admitting: Certified Nurse Midwife

## 2020-12-12 ENCOUNTER — Other Ambulatory Visit: Payer: Self-pay

## 2020-12-12 ENCOUNTER — Other Ambulatory Visit (HOSPITAL_COMMUNITY)
Admission: RE | Admit: 2020-12-12 | Discharge: 2020-12-12 | Disposition: A | Discharge status: Home / Self Care | Payer: Medicaid Other | Source: Ambulatory Visit | Attending: Certified Nurse Midwife | Admitting: Certified Nurse Midwife

## 2020-12-12 VITALS — BP 94/59 | HR 67 | Wt 115.0 lb

## 2020-12-12 DIAGNOSIS — Z01419 Encounter for gynecological examination (general) (routine) without abnormal findings: Secondary | ICD-10-CM | POA: Diagnosis present

## 2020-12-12 DIAGNOSIS — Z3041 Encounter for surveillance of contraceptive pills: Secondary | ICD-10-CM | POA: Diagnosis not present

## 2020-12-12 DIAGNOSIS — Z124 Encounter for screening for malignant neoplasm of cervix: Secondary | ICD-10-CM | POA: Insufficient documentation

## 2020-12-12 MED ORDER — SLYND 4 MG PO TABS: 1.0000 | ORAL_TABLET | Freq: Every day | ORAL | 4 refills | Status: DC

## 2020-12-12 NOTE — Progress Notes (Signed)
Pt present for annual exam. Declined flu.

## 2020-12-12 NOTE — Progress Notes (Signed)
I have seen, interviewed, and examined the patient in conjunction with the [facility] and affirm the diagnosis and management plan.   Gunnar Bulla, CNM [facility], [facility] 12/12/20 5:34 PM

## 2020-12-12 NOTE — Progress Notes (Signed)
ANNUAL PREVENTATIVE CARE GYN  ENCOUNTER NOTE  Subjective:       Haley Robles is a 19 y.o. G67P0101 female here for a routine annual gynecologic exam.  Current complaints: 1. Reports breakthrough bleeding on Depo, wishes to continue Slynd  Declines STI testing at this time.  Denies shortness of breath, respiratory difficulty, chest pain, abdominal pain, excessive vaginal bleeding, dysuria, and  leg swelling or pain.   Gynecologic History  Patient's last menstrual period was 11/19/2020.  Period Pattern: (!) Irregular Menstrual Flow: Light Dysmenorrhea: None  Contraception: oral progesterone-only contraceptive   Last Pap: due  Obstetric History  OB History  Gravida Para Term Preterm AB Living  1 1   1   1   SAB IAB Ectopic Multiple Live Births        0 1    # Outcome Date GA Lbr Len/2nd Weight Sex Delivery Anes PTL Lv  1 Preterm 05/12/20 [redacted]w[redacted]d 02:30 / 00:09 2790 g M Vag-Spont None  LIV    Past Medical History:  Diagnosis Date   Abdominal pain, recurrent    Anxiety    Diarrhea    Vomiting     Past Surgical History:  Procedure Laterality Date   NO PAST SURGERIES      No current outpatient medications on file prior to visit.   No current facility-administered medications on file prior to visit.    No Known Allergies  Social History   Socioeconomic History   Marital status: Single    Spouse name: Not on file   Number of children: Not on file   Years of education: Not on file   Highest education level: Not on file  Occupational History   Not on file  Tobacco Use   Smoking status: Never Smoker   Smokeless tobacco: Never Used  Vaping Use   Vaping Use: Never used  Substance and Sexual Activity   Alcohol use: Yes    Comment: occasionally   Drug use: Not Currently   Sexual activity: Yes    Birth control/protection: Injection  Other Topics Concern   Not on file  Social History Narrative   6th grade   Social Determinants of Health    Financial Resource Strain: Not on file  Food Insecurity: Not on file  Transportation Needs: Not on file  Physical Activity: Not on file  Stress: Not on file  Social Connections: Not on file  Intimate Partner Violence: Not on file    Family History  Problem Relation Age of Onset   Pancreatitis Father    Cholelithiasis Father    Ulcers Father    Ulcers Maternal Grandmother    Cholelithiasis Maternal Grandfather    Crohn's disease Cousin     The following portions of the patient's history were reviewed and updated as appropriate: allergies, current medications, past family history, past medical history, past social history, past surgical history and problem list.  Review of Systems  ROS- Negative other then what was reported above. Information obtained from patient verbally.    Objective:   BP (!) 94/59    Pulse 67    Wt 52.2 kg    LMP 11/19/2020    BMI 19.74 kg/m    CONSTITUTIONAL: Well-developed, well-nourished female in no acute distress.   PSYCHIATRIC: Normal mood and affect. Normal behavior. Normal judgment and thought content.  NEUROLGIC: Alert and oriented to person, place, and time. Normal muscle tone coordination. No cranial nerve deficit noted.  EYES: Conjunctivae and EOM are normal.  NECK: Normal range of motion, supple, no masses.  Normal thyroid.   SKIN: Skin is warm and dry. No rash noted. Not diaphoretic. No erythema. No pallor. Professional tattoo and belly ring noted.   CARDIOVASCULAR: Normal heart rate noted, regular rhythm, no murmur.  RESPIRATORY: Clear to auscultation bilaterally. Effort and breath sounds normal, no problems with respiration noted.  BREASTS: Symmetric in size. No masses, skin changes, nipple drainage, or lymphadenopathy.  ABDOMEN: Soft, normal bowel sounds, no distention noted.  No tenderness, rebound or guarding.   PELVIC:  External Genitalia: Normal  BUS: Normal  Vagina: Normal  Cervix: Normal, bleeding   Uterus:  Normal  Adnexa: Normal   MUSCULOSKELETAL: Normal range of motion. No tenderness.  No cyanosis, clubbing, or edema.  2+ distal pulses.   Assessment:   Annual gynecologic examination 19 y.o.   Contraception: oral progesterone-only contraceptive, Slynd   Normal BMI   Problem List Items Addressed This Visit   None   Visit Diagnoses    Well woman exam    -  Primary   Relevant Orders   Cytology - PAP   Screening for cervical cancer       Relevant Orders   Cytology - PAP   Oral contraceptive pill surveillance          Plan:   Pap: Pap, Reflex if ASCUS  Labs: Declines labs    Routine preventative health maintenance measures emphasized: Exercise/Diet/Weight control, Tobacco Warnings, Alcohol/Substance use risks, Stress Management, Peer Pressure Issues and Safe Sex; see AVS  Rx Slynd, see orders  RTC x 1 year for ANNUAL well woman's exam with JML or sooner if needed.  Juliann Pares, Student-MidWife  [employer] 12/12/20 3:12 PM

## 2020-12-12 NOTE — Patient Instructions (Addendum)
Drospirenone tablets (contraception) What is this medicine? DROSPIRENONE (dro SPY re nown) is an oral contraceptive (birth control pill). The product contains a female hormone known as a progestin. It is used to prevent pregnancy. This medicine may be used for other purposes; ask your health care provider or pharmacist if you have questions. COMMON BRAND NAME(S): Slynd What should I tell my health care provider before I take this medicine? They need to know if you have any of these conditions:  abnormal vaginal bleeding  adrenal gland disease  blood vessel disease or blood clots  breast, cervical, endometrial, ovarian, liver, or uterine cancer  diabetes  heart disease or recent heart attack  high potassium level  kidney disease  liver disease  mental depression  migraine headaches  stroke  an unusual or allergic reaction to drospirenone, progestins, or other medicines, foods, dyes, or preservatives  pregnant or trying to get pregnant  breast-feeding How should I use this medicine? Take this medicine by mouth. To reduce nausea, this medicine may be taken with food. Follow the directions on the prescription label. Take this medicine at the same time each day and in the order directed on the package. Do not take your medicine more often than directed. A patient package insert for the product will be given with each prescription and refill. Read this sheet carefully each time. The sheet may change frequently. Talk to your pediatrician regarding the use of this medicine in children. Special care may be needed. This medicine has been used in female children who have started having menstrual periods. Overdosage: If you think you have taken too much of this medicine contact a poison control center or emergency room at once. NOTE: This medicine is only for you. Do not share this medicine with others. What if I miss a dose? If you miss a dose, take it as soon as you can and refer to  the patient information sheet you received with your medicine for direction. If you miss more than one pill, this medicine may not be as effective and you may need to use another form of birth control. What may interact with this medicine? Do not take this medicine with any of the following medications:  atazanavir; cobicistat  bosentan  fosamprenavir This medicine may also interact with the following medications:  aprepitant  barbiturates like phenobarbital, primidone  carbamazepine  certain antibiotics like clarithromycin, rifampin, rifabutin, rifapentine  certain antivirals for HIV or hepatitis  certain diuretics like amiloride, spironolactone, triamterene  certain medicines for fungal infections like griseofulvin, ketoconazole, itraconazole, voriconazole  certain medicines for blood pressure, heart disease  cyclosporine  felbamate  heparin  medicines for diabetes  modafinil  NSAIDs, medicines for pain and inflammation, like ibuprofen or naproxen  oxcarbazepine  phenytoin  potassium supplements  rufinamide  St. John's wort  topiramate This list may not describe all possible interactions. Give your health care provider a list of all the medicines, herbs, non-prescription drugs, or dietary supplements you use. Also tell them if you smoke, drink alcohol, or use illegal drugs. Some items may interact with your medicine. What should I watch for while using this medicine? Visit your doctor or health care professional for regular checks on your progress. You will need a regular breast and pelvic exam and Pap smear while on this medicine. You may need blood work done while you are taking this medicine. If you have any reason to think you are pregnant, stop taking this medicine right away and contact your doctor  or health care professional. This medicine does not protect you against HIV infection (AIDS) or any other sexually transmitted diseases. If you are going to  have elective surgery, you may need to stop taking this medicine before the surgery. Consult your health care professional for advice. What side effects may I notice from receiving this medicine? Side effects that you should report to your doctor or health care professional as soon as possible:  allergic reactions like skin rash, itching or hives, swelling of the face, lips, or tongue  breast tissue changes or discharge  depressed mood  severe pain, swelling, or tenderness in the abdomen  signs and symptoms of a blood clot such as chest pain; shortness of breath; pain, swelling, or warmth in the leg  signs and symptoms of increased potassium like muscle weakness; chest pain; or fast, irregular heartbeat  signs and symptoms of liver injury like dark yellow or brown urine; general ill feeling or flu-like symptoms; light-colored stools; loss of appetite; nausea; right upper belly pain; unusually weak or tired; yellowing of the eyes or skin  signs and symptoms of a stroke like changes in vision; confusion; trouble speaking or understanding; severe headaches; sudden numbness or weakness of the face, arm or leg; trouble walking; dizziness; loss of balance or coordination  unusual vaginal bleeding  unusually weak or tired Side effects that usually do not require medical attention (report these to your doctor or health care professional if they continue or are bothersome):  acne  breast tenderness  headache  menstrual cramps  nausea  weight gain This list may not describe all possible side effects. Call your doctor for medical advice about side effects. You may report side effects to FDA at 1-800-FDA-1088. Where should I keep my medicine? Keep out of the reach of children. Store at room temperature between 20 and 25 degrees C (68 and 77 degrees F). Throw away any unused medicine after the expiration date. NOTE: This sheet is a summary. It may not cover all possible information. If you  have questions about this medicine, talk to your doctor, pharmacist, or health care provider.  2020 Elsevier/Gold Standard (2018-05-14 15:01:56)   Preventive Care 91-21 Years Old, Female Preventive care refers to visits with your health care provider and lifestyle choices that can promote health and wellness. This includes:  A yearly physical exam. This may also be called an annual well check.  Regular dental visits and eye exams.  Immunizations.  Screening for certain conditions.  Healthy lifestyle choices, such as eating a healthy diet, getting regular exercise, not using drugs or products that contain nicotine and tobacco, and limiting alcohol use. What can I expect for my preventive care visit? Physical exam Your health care provider will check your:  Height and weight. This may be used to calculate body mass index (BMI), which tells if you are at a healthy weight.  Heart rate and blood pressure.  Skin for abnormal spots. Counseling Your health care provider may ask you questions about your:  Alcohol, tobacco, and drug use.  Emotional well-being.  Home and relationship well-being.  Sexual activity.  Eating habits.  Work and work Statistician.  Method of birth control.  Menstrual cycle.  Pregnancy history. What immunizations do I need?  Influenza (flu) vaccine  This is recommended every year. Tetanus, diphtheria, and pertussis (Tdap) vaccine  You may need a Td booster every 10 years. Varicella (chickenpox) vaccine  You may need this if you have not been vaccinated. Human papillomavirus (HPV)  vaccine  If recommended by your health care provider, you may need three doses over 6 months. Measles, mumps, and rubella (MMR) vaccine  You may need at least one dose of MMR. You may also need a second dose. Meningococcal conjugate (MenACWY) vaccine  One dose is recommended if you are age 55-21 years and a first-year college student living in a residence hall,  or if you have one of several medical conditions. You may also need additional booster doses. Pneumococcal conjugate (PCV13) vaccine  You may need this if you have certain conditions and were not previously vaccinated. Pneumococcal polysaccharide (PPSV23) vaccine  You may need one or two doses if you smoke cigarettes or if you have certain conditions. Hepatitis A vaccine  You may need this if you have certain conditions or if you travel or work in places where you may be exposed to hepatitis A. Hepatitis B vaccine  You may need this if you have certain conditions or if you travel or work in places where you may be exposed to hepatitis B. Haemophilus influenzae type b (Hib) vaccine  You may need this if you have certain conditions. You may receive vaccines as individual doses or as more than one vaccine together in one shot (combination vaccines). Talk with your health care provider about the risks and benefits of combination vaccines. What tests do I need?  Blood tests  Lipid and cholesterol levels. These may be checked every 5 years starting at age 12.  Hepatitis C test.  Hepatitis B test. Screening  Diabetes screening. This is done by checking your blood sugar (glucose) after you have not eaten for a while (fasting).  Sexually transmitted disease (STD) testing.  BRCA-related cancer screening. This may be done if you have a family history of breast, ovarian, tubal, or peritoneal cancers.  Pelvic exam and Pap test. This may be done every 3 years starting at age 31. Starting at age 78, this may be done every 5 years if you have a Pap test in combination with an HPV test. Talk with your health care provider about your test results, treatment options, and if necessary, the need for more tests. Follow these instructions at home: Eating and drinking   Eat a diet that includes fresh fruits and vegetables, whole grains, lean protein, and low-fat dairy.  Take vitamin and mineral  supplements as recommended by your health care provider.  Do not drink alcohol if: ? Your health care provider tells you not to drink. ? You are pregnant, may be pregnant, or are planning to become pregnant.  If you drink alcohol: ? Limit how much you have to 0-1 drink a day. ? Be aware of how much alcohol is in your drink. In the [place] one drink equals one 12 oz bottle of beer (355 mL), one 5 oz glass of wine (148 mL), or one 1 oz glass of hard liquor (44 mL). Lifestyle  Take daily care of your teeth and gums.  Stay active. Exercise for at least 30 minutes on 5 or more days each week.  Do not use any products that contain nicotine or tobacco, such as cigarettes, e-cigarettes, and chewing tobacco. If you need help quitting, ask your health care provider.  If you are sexually active, practice safe sex. Use a condom or other form of birth control (contraception) in order to prevent pregnancy and STIs (sexually transmitted infections). If you plan to become pregnant, see your health care provider for a preconception visit. What's next?  Visit your health care provider once a year for a well check visit.  Ask your health care provider how often you should have your eyes and teeth checked.  Stay up to date on all vaccines. This information is not intended to replace advice given to you by your health care provider. Make sure you discuss any questions you have with your health care provider. Document Revised: 08/14/2018 Document Reviewed: 08/14/2018 Elsevier Patient Education  2020 [employer].

## 2020-12-14 LAB — CYTOLOGY - PAP: Diagnosis: NEGATIVE

## 2021-02-24 IMAGING — US US ABDOMEN LIMITED
1 series · 14 of 25 positions shown · non-contrast
Comparison: None.

CLINICAL DATA: Nausea and vomiting

EXAM:
ULTRASOUND ABDOMEN LIMITED RIGHT UPPER QUADRANT

[Series 1: us abdomen limited · 14 of 47 slices shown]
[im 1/47]
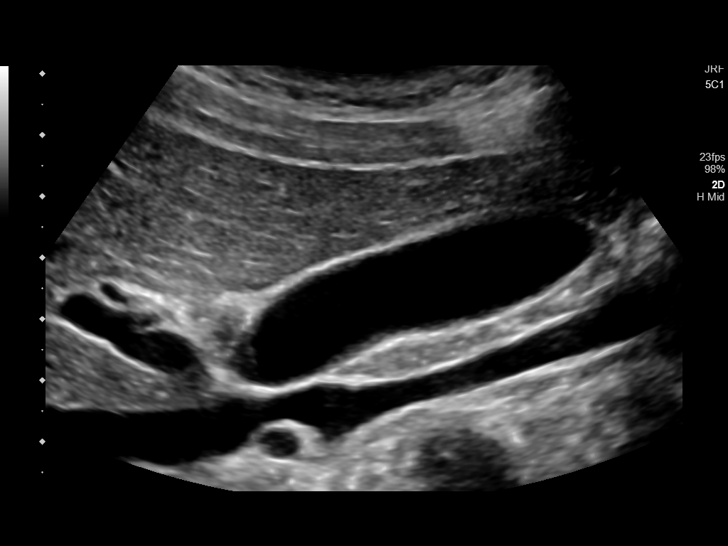
[im 4/47]
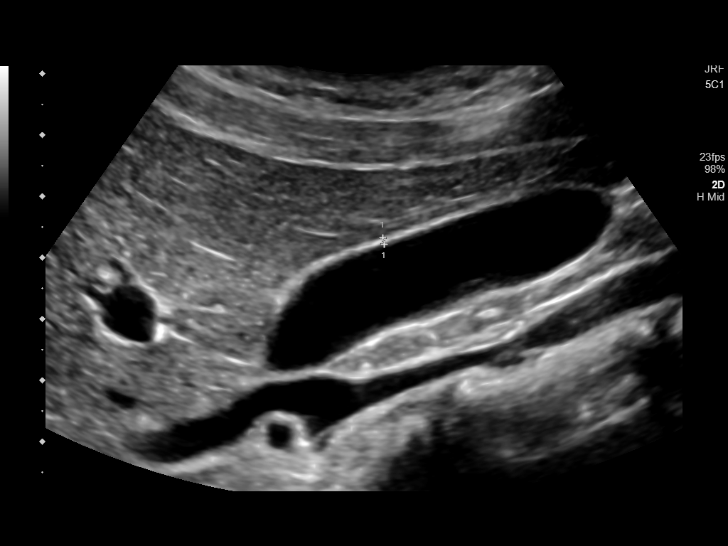
[im 8/47]
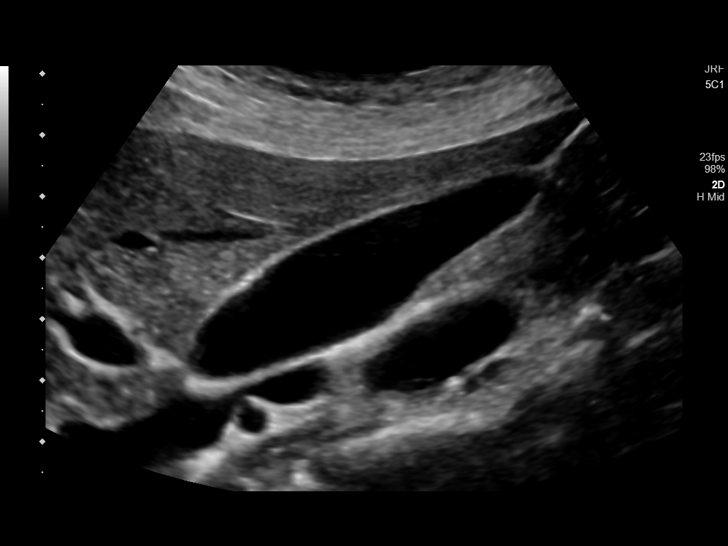
[im 12/47]
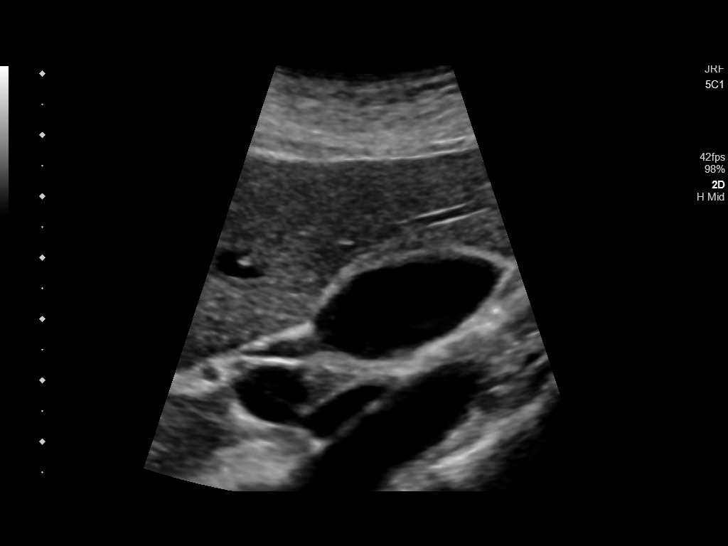
[im 16/47]
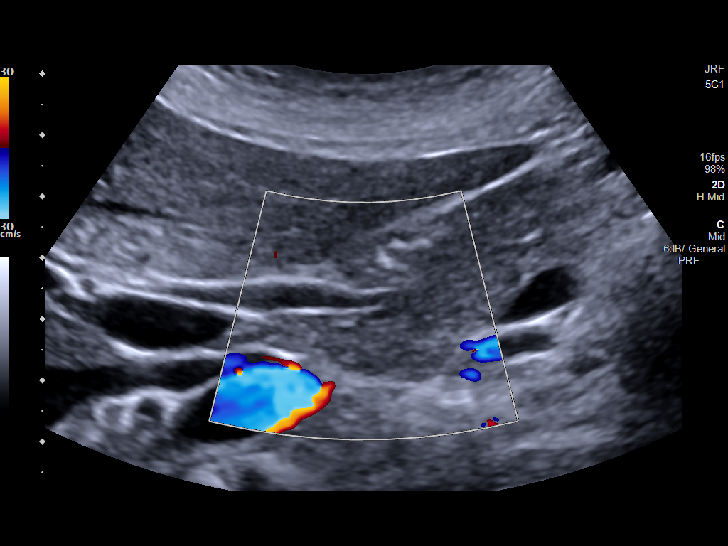
[im 18/47]
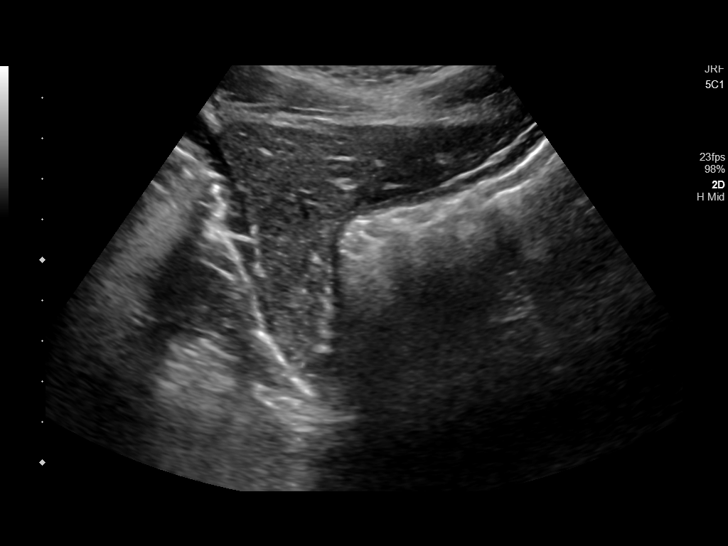
[im 22/47]
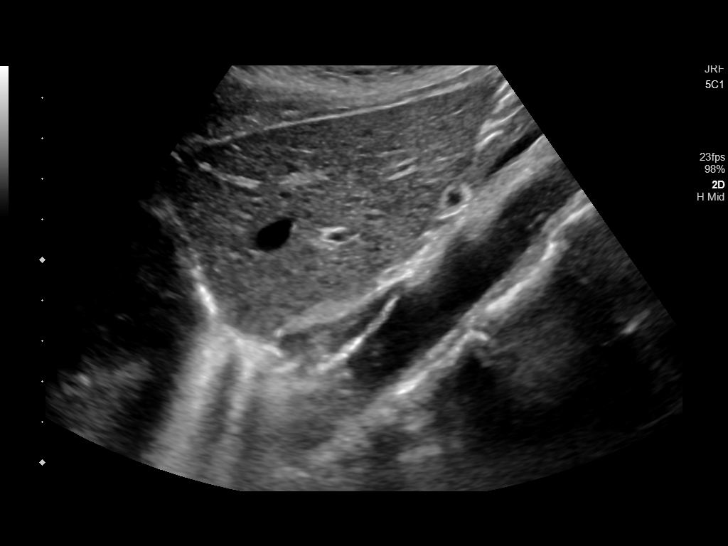
[im 25/47]
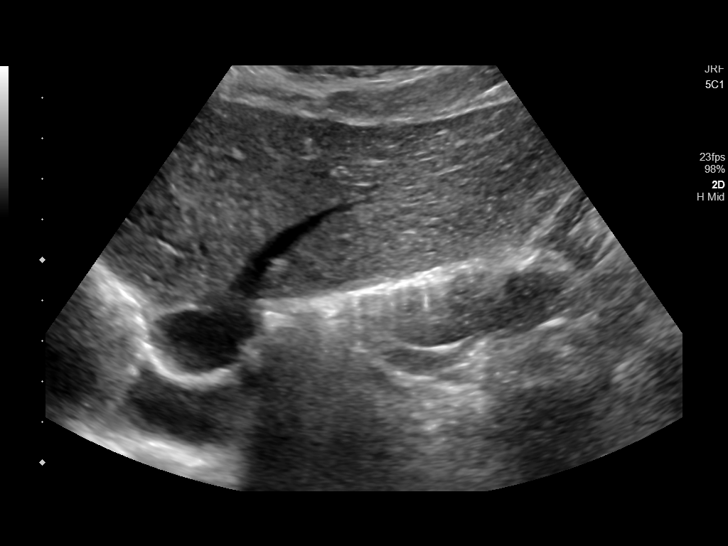
[im 29/47]
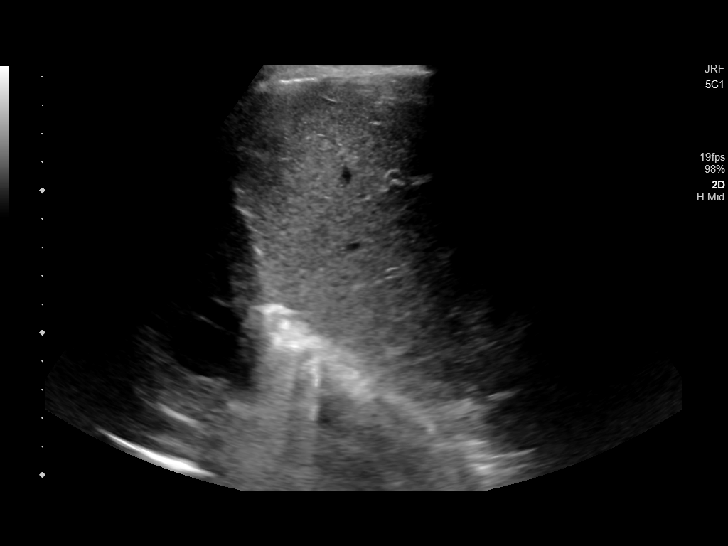
[im 31/47]
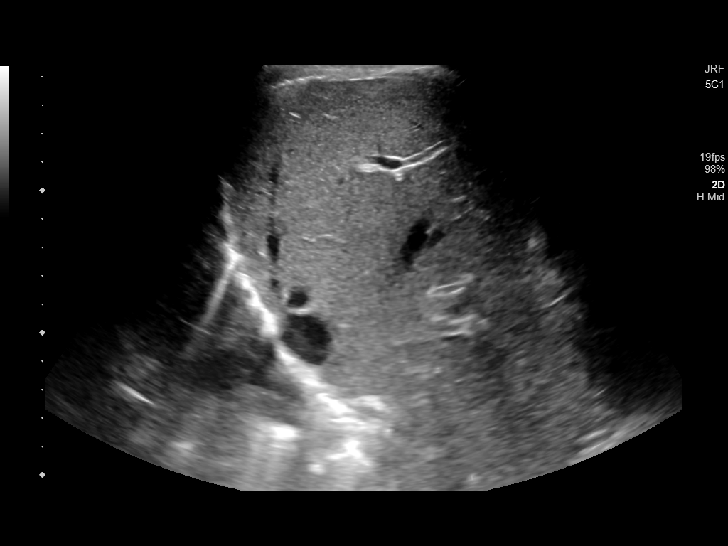
[im 35/47]
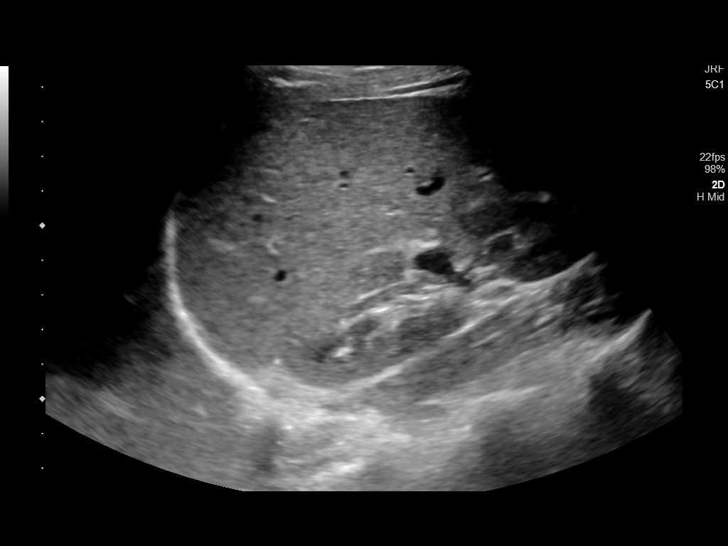
[im 39/47]
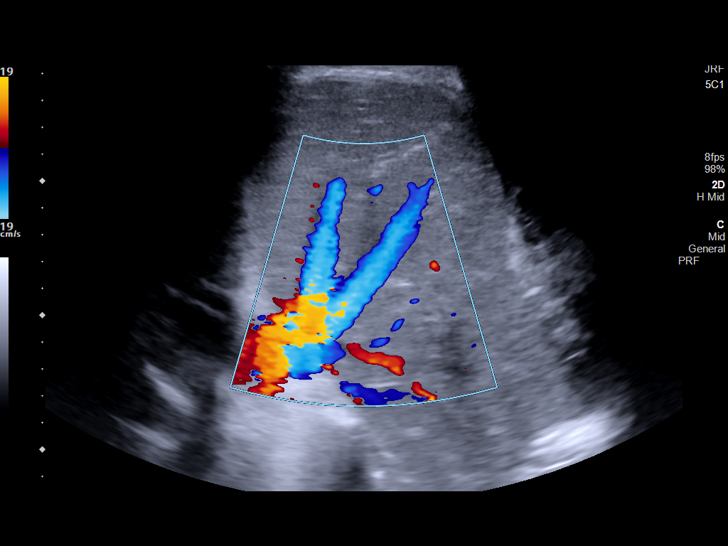
[im 43/47]
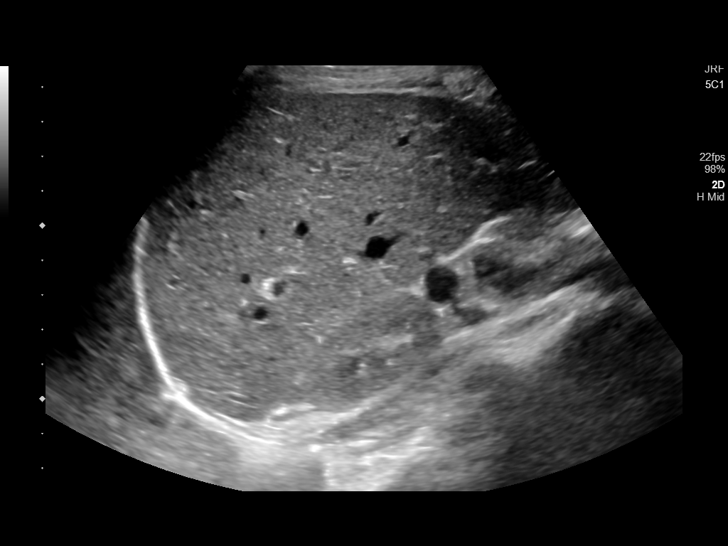
[im 47/47]
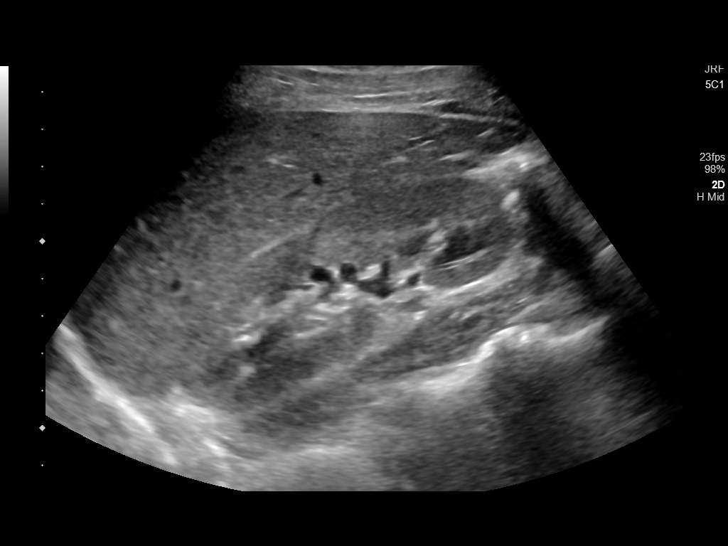

[14 of 25 positions shown; findings below may reference images not displayed]

FINDINGS: Gallbladder:

No gallstones or wall thickening visualized. No sonographic Murphy
sign noted by sonographer.

Common bile duct:

Diameter: 4 mm

Liver:

No focal lesion identified. Within normal limits in parenchymal
echogenicity. Portal vein is patent on color Doppler imaging with
normal direction of blood flow towards the liver.

Other: None.
IMPRESSION: Normal right upper quadrant ultrasound.

## 2021-06-30 IMAGING — US US ABDOMEN LIMITED
1 series · 14 of 25 positions shown · non-contrast
Comparison: Ultrasound dated 10/15/2019.

CLINICAL DATA: 18-year-old female with abdominal pain.

EXAM:
ULTRASOUND ABDOMEN LIMITED RIGHT UPPER QUADRANT

[Series 1: us abdomen limited · 0.15mm/px · 14 of 47 slices shown]
[im 1/47]
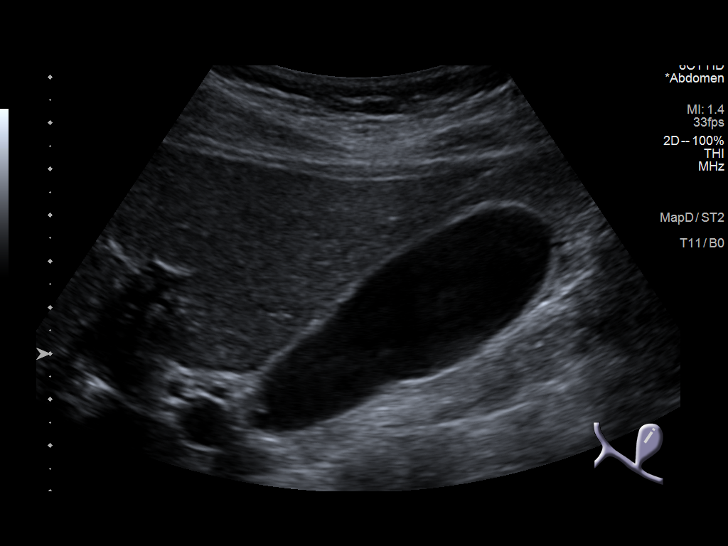
[im 4/47]
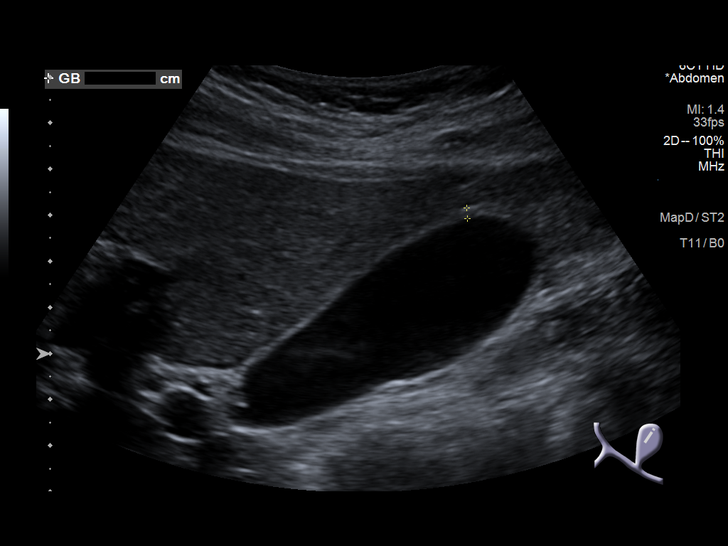
[im 8/47]
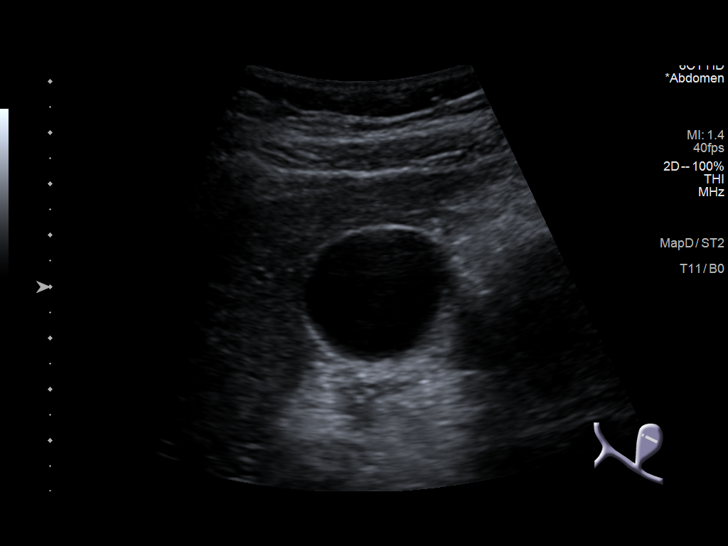
[im 12/47]
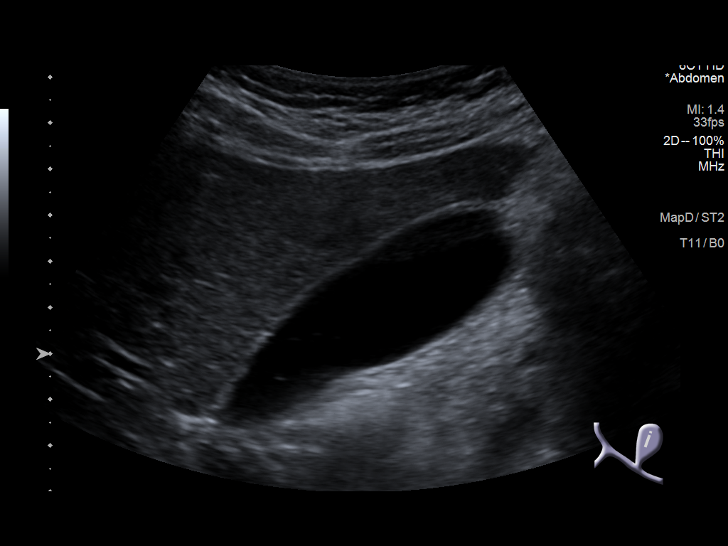
[im 16/47]
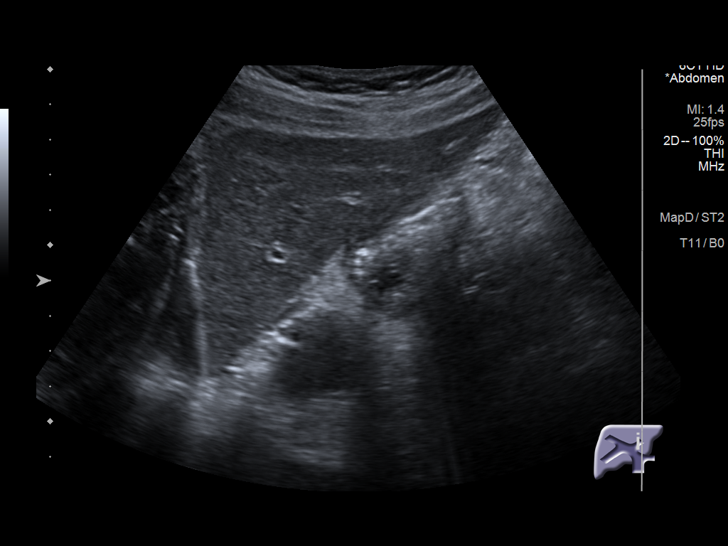
[im 18/47]
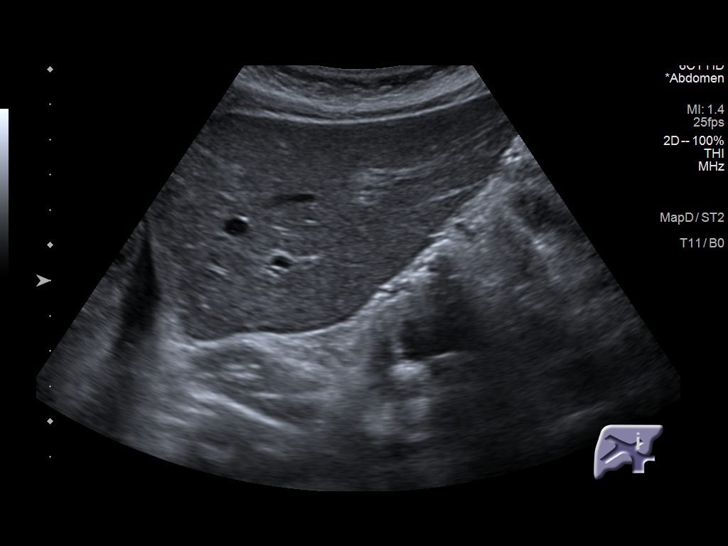
[im 22/47]
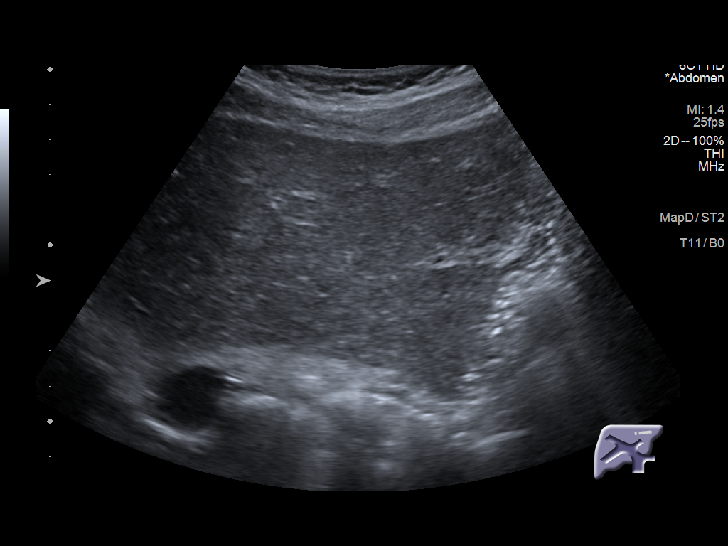
[im 25/47]
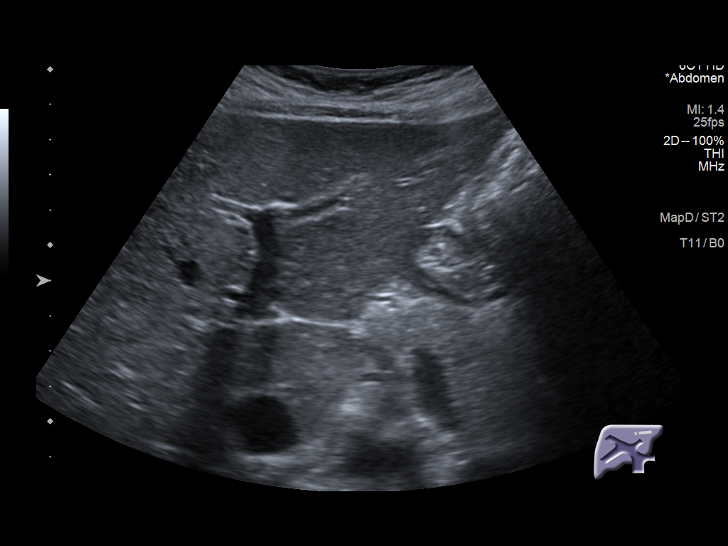
[im 29/47]
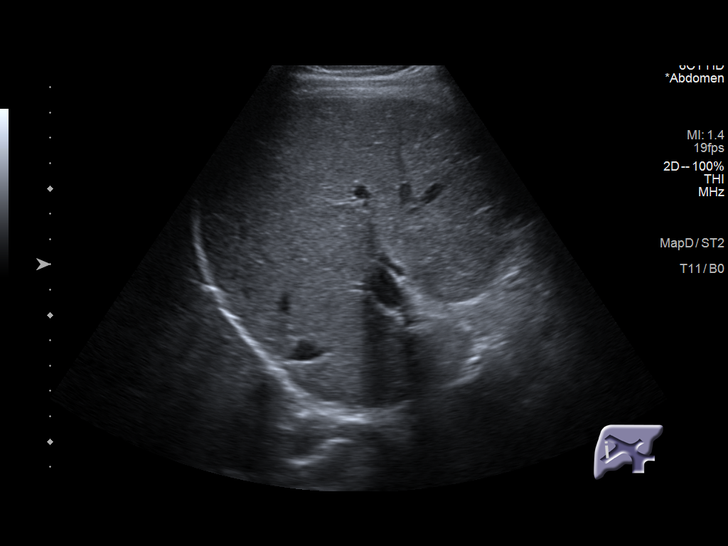
[im 31/47]
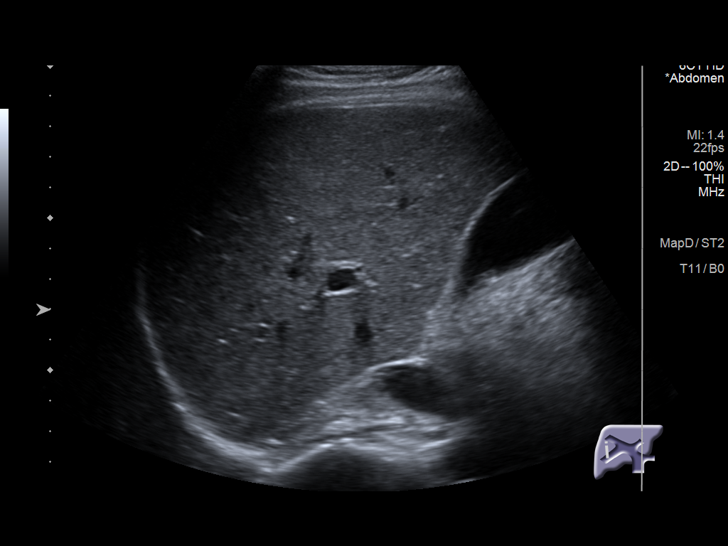
[im 35/47]
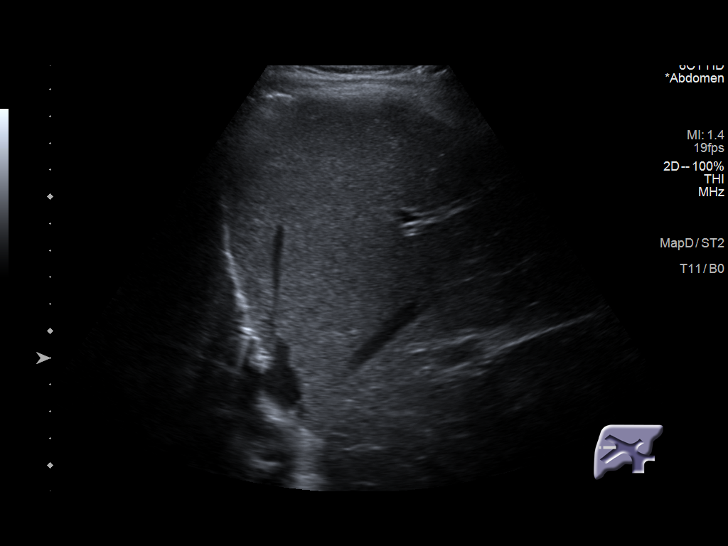
[im 39/47]
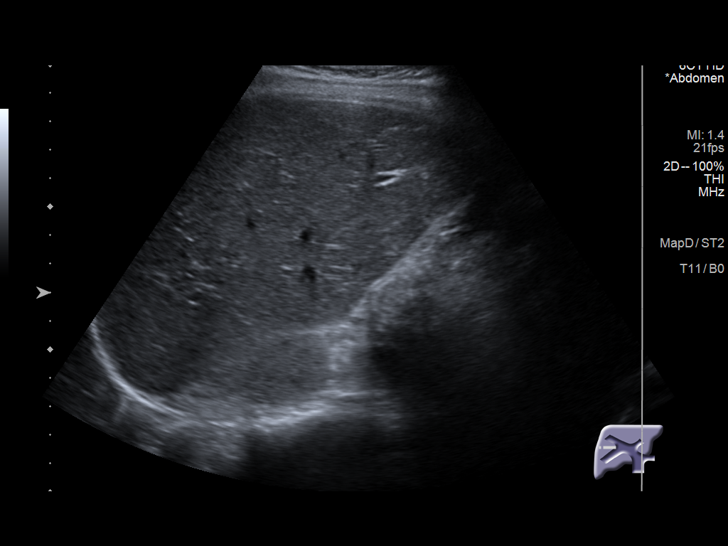
[im 43/47]
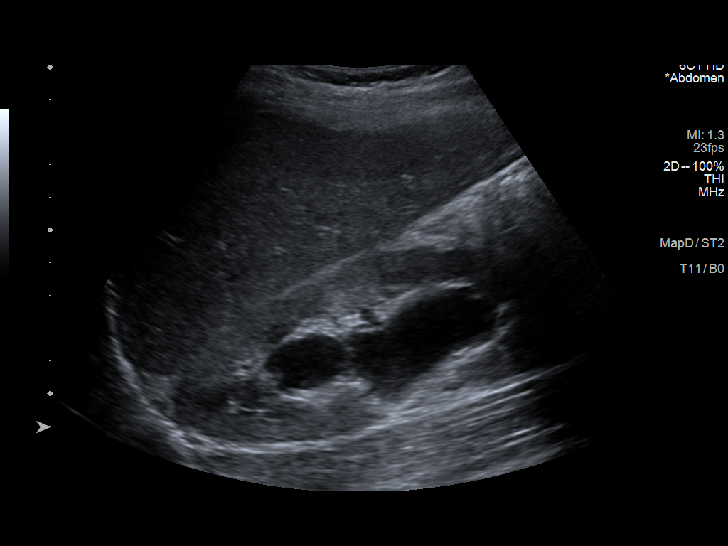
[im 47/47]
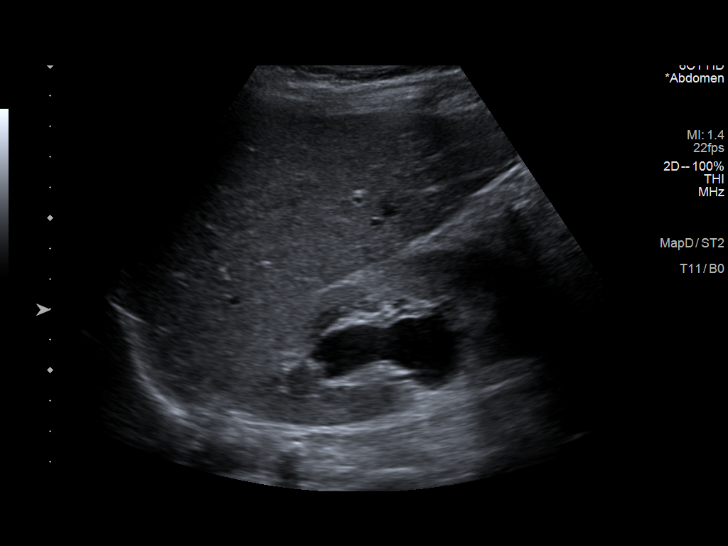

[14 of 25 positions shown; findings below may reference images not displayed]

FINDINGS: Gallbladder:

No gallstones or wall thickening visualized. No sonographic Murphy
sign noted by sonographer.

Common bile duct:

Diameter: 3 mm

Liver:

The liver is unremarkable. Portal vein is patent on color Doppler
imaging with normal direction of blood flow towards the liver.

Other: There is mild right hydronephrosis.
IMPRESSION: Mild right hydronephrosis, otherwise unremarkable right upper
quadrant ultrasound.

## 2021-12-25 ENCOUNTER — Encounter: Payer: Medicaid Other | Admitting: Certified Nurse Midwife

## 2022-01-17 ENCOUNTER — Emergency Department: Payer: Medicaid Other

## 2022-01-17 ENCOUNTER — Other Ambulatory Visit: Payer: Self-pay

## 2022-01-17 ENCOUNTER — Emergency Department
Admission: EM | Admit: 2022-01-17 | Discharge: 2022-01-17 | Disposition: A | Discharge status: Home / Self Care | Payer: Medicaid Other | Attending: Emergency Medicine | Admitting: Emergency Medicine

## 2022-01-17 ENCOUNTER — Encounter: Payer: Self-pay | Admitting: Emergency Medicine

## 2022-01-17 DIAGNOSIS — Z20822 Contact with and (suspected) exposure to covid-19: Secondary | ICD-10-CM | POA: Diagnosis not present

## 2022-01-17 DIAGNOSIS — R102 Pelvic and perineal pain: Secondary | ICD-10-CM | POA: Diagnosis not present

## 2022-01-17 DIAGNOSIS — K529 Noninfective gastroenteritis and colitis, unspecified: Secondary | ICD-10-CM | POA: Diagnosis not present

## 2022-01-17 DIAGNOSIS — R197 Diarrhea, unspecified: Secondary | ICD-10-CM

## 2022-01-17 DIAGNOSIS — R112 Nausea with vomiting, unspecified: Secondary | ICD-10-CM | POA: Diagnosis present

## 2022-01-17 LAB — COMPREHENSIVE METABOLIC PANEL
ALT: 22 U/L (ref 0–44)
AST: 32 U/L (ref 15–41)
Albumin: 5.5 g/dL — ABNORMAL HIGH (ref 3.5–5.0)
Alkaline Phosphatase: 77 U/L (ref 38–126)
Anion gap: 12 (ref 5–15)
BUN: 26 mg/dL — ABNORMAL HIGH (ref 6–20)
CO2: 24 mmol/L (ref 22–32)
Calcium: 10.2 mg/dL (ref 8.9–10.3)
Chloride: 100 mmol/L (ref 98–111)
Creatinine, Ser: 0.93 mg/dL (ref 0.44–1.00)
GFR, Estimated: >60 mL/min (ref >60)
Glucose, Bld: 173 mg/dL — ABNORMAL HIGH (ref 70–99)
Potassium: 4 mmol/L (ref 3.5–5.1)
Sodium: 136 mmol/L (ref 135–145)
Total Bilirubin: 0.8 mg/dL (ref 0.3–1.2)
Total Protein: 9.6 g/dL — ABNORMAL HIGH (ref 6.5–8.1)

## 2022-01-17 LAB — CBC WITH DIFFERENTIAL/PLATELET
Abs Immature Granulocytes: 0.18 10*3/uL — ABNORMAL HIGH (ref 0.00–0.07)
Basophils Absolute: 0 10*3/uL (ref 0.0–0.1)
Basophils Relative: 0 %
Eosinophils Absolute: 0 10*3/uL (ref 0.0–0.5)
Eosinophils Relative: 0 %
HCT: 47.2 % — ABNORMAL HIGH (ref 36.0–46.0)
Hemoglobin: 16.1 g/dL — ABNORMAL HIGH (ref 12.0–15.0)
Immature Granulocytes: 1 %
Lymphocytes Relative: 2 %
Lymphs Abs: 0.6 10*3/uL — ABNORMAL LOW (ref 0.7–4.0)
MCH: 30.6 pg (ref 26.0–34.0)
MCHC: 34.1 g/dL (ref 30.0–36.0)
MCV: 89.6 fL (ref 80.0–100.0)
Monocytes Absolute: 1.5 10*3/uL — ABNORMAL HIGH (ref 0.1–1.0)
Monocytes Relative: 6 %
Neutro Abs: 23.9 10*3/uL — ABNORMAL HIGH (ref 1.7–7.7)
Neutrophils Relative %: 91 %
Platelets: 276 10*3/uL (ref 150–400)
RBC: 5.27 MIL/uL — ABNORMAL HIGH (ref 3.87–5.11)
RDW: 12.6 % (ref 11.5–15.5)
Smear Review: NORMAL
WBC Morphology: INCREASED
WBC: 26.3 10*3/uL — ABNORMAL HIGH (ref 4.0–10.5)
nRBC: 0 % (ref 0.0–0.2)

## 2022-01-17 LAB — URINALYSIS, ROUTINE W REFLEX MICROSCOPIC
Bilirubin Urine: NEGATIVE
Glucose, UA: NEGATIVE mg/dL
Ketones, ur: NEGATIVE mg/dL
Leukocytes,Ua: NEGATIVE
Nitrite: NEGATIVE
Protein, ur: NEGATIVE mg/dL
Specific Gravity, Urine: <1.005 — ABNORMAL LOW (ref 1.005–1.030)
pH: 5 (ref 5.0–8.0)

## 2022-01-17 LAB — GASTROINTESTINAL PANEL BY PCR, STOOL (REPLACES STOOL CULTURE)

## 2022-01-17 LAB — HCG, QUANTITATIVE, PREGNANCY: hCG, Beta Chain, Quant, S: <1 m[IU]/mL (ref <5)

## 2022-01-17 LAB — C DIFFICILE QUICK SCREEN W PCR REFLEX
C Diff antigen: NEGATIVE
C Diff interpretation: NOT DETECTED
C Diff toxin: NEGATIVE

## 2022-01-17 LAB — RESP PANEL BY RT-PCR (FLU A&B, COVID) ARPGX2
Influenza A by PCR: NEGATIVE
Influenza B by PCR: NEGATIVE
SARS Coronavirus 2 by RT PCR: NEGATIVE

## 2022-01-17 LAB — LACTIC ACID, PLASMA
Lactic Acid, Venous: 2.6 mmol/L (ref 0.5–1.9)
Lactic Acid, Venous: 1.6 mmol/L (ref 0.5–1.9)

## 2022-01-17 LAB — LIPASE, BLOOD: Lipase: 26 U/L (ref 11–51)

## 2022-01-17 MED ORDER — LOPERAMIDE HCL 2 MG PO TABS: 2.0000 mg | ORAL_TABLET | Freq: Three times a day (TID) | ORAL | 0 refills | Status: AC | PRN

## 2022-01-17 MED ORDER — MORPHINE SULFATE (PF) 4 MG/ML IV SOLN
4.0000 mg | Freq: Once | INTRAVENOUS | Status: AC
  Administered 2022-01-17: 4 mg via INTRAVENOUS
  Filled 2022-01-17: qty 1

## 2022-01-17 MED ORDER — ONDANSETRON 4 MG PO TBDP: 4.0000 mg | ORAL_TABLET | Freq: Three times a day (TID) | ORAL | 0 refills | Status: DC | PRN

## 2022-01-17 MED ORDER — ONDANSETRON HCL 4 MG/2ML IJ SOLN
4.0000 mg | Freq: Once | INTRAMUSCULAR | Status: AC
  Administered 2022-01-17: 4 mg via INTRAVENOUS
  Filled 2022-01-17: qty 2

## 2022-01-17 MED ORDER — LOPERAMIDE HCL 2 MG PO CAPS
2.0000 mg | ORAL_CAPSULE | Freq: Once | ORAL | Status: AC
  Administered 2022-01-17: 2 mg via ORAL
  Filled 2022-01-17: qty 1

## 2022-01-17 MED ORDER — SODIUM CHLORIDE 0.9 % IV BOLUS: 500.0000 mL | Freq: Once | INTRAVENOUS | Status: DC

## 2022-01-17 MED ORDER — SODIUM CHLORIDE 0.9 % IV BOLUS
1000.0000 mL | Freq: Once | INTRAVENOUS | Status: AC
  Administered 2022-01-17: 1000 mL via INTRAVENOUS

## 2022-01-17 MED ORDER — IOHEXOL 300 MG/ML  SOLN
80.0000 mL | Freq: Once | INTRAMUSCULAR | Status: AC | PRN
  Administered 2022-01-17: 80 mL via INTRAVENOUS
  Filled 2022-01-17: qty 80

## 2022-01-17 NOTE — ED Notes (Signed)
Rechecked T, 98.9 oral. Gave gingerale with ice after this, per request, ok per provider.

## 2022-01-17 NOTE — Discharge Instructions (Signed)
Your exam, labs, CT, chest x-ray, and ultrasound are all reassuring at this time.  No serious internal infection to account for your elevated white count.  Symptoms seem to be related to a likely viral gastroenteritis.  Continue to drink fluids to help prevent dehydration.  Take the prescription antinausea medicine antidiarrhea medicine as needed.  Be sure to use warm soapy water to hand wash as most viral GI bugs will not be appropriately killed by using alcohol-based sanitizers.  Follow-up with primary provider or return to the ED if needed.

## 2022-01-17 NOTE — ED Triage Notes (Signed)
Patient ambulatory to triage with steady gait, without difficulty or distress noted; pt reports N/V/D and abd pain since last night

## 2022-01-17 NOTE — ED Provider Notes (Signed)
[facility] Provider Note  Patient Contact: 7:25 AM (approximate)   History   Emesis   HPI  Haley Robles is a 21 y.o. female presents to the ED for evaluation of N/V/D, with associated generalized abdominal pain with onset last night.  She notes sudden onset with intermittent RLQ abdominal pain. Patient denies any fevers, chills, sweats patient also denies any bowel changes, sick contacts, bad food exposure.  No reported chest pain or shortness of breath at this time.     Physical Exam   Triage Vital Signs: ED Triage Vitals  Enc Vitals Group     BP 01/17/22 0558 96/84     Pulse Rate 01/17/22 0558 (!) 121     Resp 01/17/22 0558 18     Temp 01/17/22 0558 98.1 F (36.7 C)     Temp Source 01/17/22 0558 Oral     SpO2 01/17/22 0558 99 %     Weight 01/17/22 0549 118 lb (53.5 kg)     Height 01/17/22 0549 5\' 4"  (1.626 m)     Head Circumference --      Peak Flow --      Pain Score 01/17/22 0549 9     Pain Loc --      Pain Edu? --      Excl. in [place]? --     Most recent vital signs: Vitals:   01/17/22 1400 01/17/22 1721  BP: 99/65 100/68  Pulse: (!) 106 94  Resp: 20 18  Temp:    SpO2: 99% 100%     General: Alert and in no acute distress. Cardiovascular:  Good peripheral perfusion Respiratory: Normal respiratory effort without tachypnea or retractions. Lungs CTAB.  Gastrointestinal: Bowel sounds 4 quadrants. Soft and mildly tender to palpation over the RLQ, specifically, and abdominal generally. No guarding or rigidity. No palpable masses. No distention. No CVA tenderness. GU: deferred Musculoskeletal: Full range of motion to all extremities.  Neurologic:  No gross focal neurologic deficits are appreciated.  Skin:   No rash noted Other:   ED Results / Procedures / Treatments   Labs (all labs ordered are listed, but only abnormal results are displayed) Labs Reviewed  GASTROINTESTINAL PANEL BY PCR, STOOL (REPLACES STOOL CULTURE) - Abnormal;  Notable for the following components:      Result Value   Norovirus GI/GII DETECTED (*)    All other components within normal limits  CBC WITH DIFFERENTIAL/PLATELET - Abnormal; Notable for the following components:   WBC 26.3 (*)    RBC 5.27 (*)    Hemoglobin 16.1 (*)    HCT 47.2 (*)    Neutro Abs 23.9 (*)    Lymphs Abs 0.6 (*)    Monocytes Absolute 1.5 (*)    Abs Immature Granulocytes 0.18 (*)    All other components within normal limits  COMPREHENSIVE METABOLIC PANEL - Abnormal; Notable for the following components:   Glucose, Bld 173 (*)    BUN 26 (*)    Total Protein 9.6 (*)    Albumin 5.5 (*)    All other components within normal limits  URINALYSIS, ROUTINE W REFLEX MICROSCOPIC - Abnormal; Notable for the following components:   APPearance CLEAR (*)    Specific Gravity, Urine <1.005 (*)    Hgb urine dipstick TRACE (*)    Bacteria, UA RARE (*)    All other components within normal limits  LACTIC ACID, PLASMA - Abnormal; Notable for the following components:   Lactic Acid, Venous 2.6 (*)  All other components within normal limits  RESP PANEL BY RT-PCR (FLU A&B, COVID) ARPGX2  C DIFFICILE QUICK SCREEN W PCR REFLEX    CULTURE, BLOOD (SINGLE)  LIPASE, BLOOD  HCG, QUANTITATIVE, PREGNANCY  LACTIC ACID, PLASMA     EKG   RADIOLOGY  I personally viewed and evaluated these images as part of my medical decision making, as well as reviewing the written report by the radiologist.  ED Provider Interpretation: no acute findings  DG Chest 2 View  Result Date: 01/17/2022 CLINICAL DATA:  Cough with chest pain. EXAM: CHEST - 2 VIEW COMPARISON:  None. FINDINGS: The heart size and mediastinal contours are normal. The lungs are clear. There is no pleural effusion or pneumothorax. No acute osseous findings are identified. IMPRESSION: No active cardiopulmonary process. Electronically Signed   By: Richardean Sale M.D.   On: 01/17/2022 12:54   CT ABDOMEN PELVIS W CONTRAST  Result  Date: 01/17/2022 CLINICAL DATA:  Right lower quadrant abdominal pain. Vomiting. Cramping. EXAM: CT ABDOMEN AND PELVIS WITH CONTRAST TECHNIQUE: Multidetector CT imaging of the abdomen and pelvis was performed using the standard protocol following bolus administration of intravenous contrast. RADIATION DOSE REDUCTION: This exam was performed according to the departmental dose-optimization program which includes automated exposure control, adjustment of the mA and/or kV according to patient size and/or use of iterative reconstruction technique. CONTRAST:  4mL OMNIPAQUE IOHEXOL 300 MG/ML  SOLN COMPARISON:  None. FINDINGS: Lower chest: Unremarkable. Hepatobiliary: No suspicious focal abnormality within the liver parenchyma. There is no evidence for gallstones, gallbladder wall thickening, or pericholecystic fluid. No intrahepatic or extrahepatic biliary dilation. Pancreas: No focal mass lesion. No dilatation of the main duct. No intraparenchymal cyst. No peripancreatic edema. Spleen: No splenomegaly. No focal mass lesion. Adrenals/Urinary Tract: No adrenal nodule or mass. Kidneys unremarkable. No evidence for hydroureter. The urinary bladder appears normal for the degree of distention. Stomach/Bowel: Stomach is unremarkable. No gastric wall thickening. No evidence of outlet obstruction. Duodenum is normally positioned as is the ligament of Treitz. No small bowel wall thickening. No small bowel dilatation. The terminal ileum is normal. The appendix is normal. No gross colonic mass. No colonic wall thickening. Vascular/Lymphatic: No abdominal aortic aneurysm. There is no gastrohepatic or hepatoduodenal ligament lymphadenopathy. No retroperitoneal or mesenteric lymphadenopathy. No pelvic sidewall lymphadenopathy. Reproductive: Unremarkable. Other: Trace free fluid in the cul-de-sac. This can be a physiologic finding in a premenopausal female. Musculoskeletal: No worrisome lytic or sclerotic osseous abnormality. IMPRESSION:  1. No acute findings in the abdomen or pelvis. Specifically, no findings to explain the patient's history of right lower quadrant pain. The appendix and terminal ileum are normal. No adnexal mass. No bowel wall thickening. 2. Trace free fluid in the cul-de-sac. This can be a physiologic finding in a premenopausal female. Electronically Signed   By: Misty Stanley M.D.   On: 01/17/2022 09:45   US PELVIC COMPLETE W TRANSVAGINAL AND TORSION R/O  Result Date: 01/17/2022 CLINICAL DATA:  Pelvic pain. Nausea and vomiting since last night. Last menstrual period 01/03/2022. Premenopausal. EXAM: TRANSABDOMINAL AND TRANSVAGINAL ULTRASOUND OF PELVIS DOPPLER ULTRASOUND OF OVARIES TECHNIQUE: Both transabdominal and transvaginal ultrasound examinations of the pelvis were performed. Transabdominal technique was performed for global imaging of the pelvis including uterus, ovaries, adnexal regions, and pelvic cul-de-sac. It was necessary to proceed with endovaginal exam following the transabdominal exam to visualize the uterus, endometrium, and bilateral ovaries. Color and duplex Doppler ultrasound was utilized to evaluate blood flow to the ovaries. COMPARISON:  No prior non  obstetric pelvic ultrasound is available for comparison FINDINGS: Uterus Measurements: 8.6 x 3.7 x 6.0 cm = volume: 102 mL. No fibroids or other mass visualized. Endometrium Thickness: 6 mm. There is minimal nonspecific fluid seen within the endometrial canal of the lower uterine segment, measuring only up to approximately 1 mm in AP dimension. Right ovary Measurements: 3.5 x 2.0 x 2.3 cm = volume: 8.3 mL. Normal appearance/no adnexal mass. Left ovary Measurements: 2.9 x 1.6 x 1.6 cm = volume: 4.0 mL. Normal appearance/no adnexal mass. Pulsed Doppler evaluation of both ovaries demonstrates normal low-resistance arterial and venous waveforms. Other findings No abnormal free fluid. IMPRESSION:: IMPRESSION: 1. There is minimal nonspecific simple fluid seen within  the lower uterine segment of the endometrial canal. Normal endometrial thickness. 2. Normal appearance of the bilateral ovaries. Electronically Signed   By: Neita Garnet M.D.   On: 01/17/2022 15:21    PROCEDURES:  Critical Care performed: No  Procedures   MEDICATIONS ORDERED IN ED: Medications  ondansetron (ZOFRAN) injection 4 mg (4 mg Intravenous Given 01/17/22 0748)  morphine (PF) 4 MG/ML injection 4 mg (4 mg Intravenous Given 01/17/22 0749)  sodium chloride 0.9 % bolus 1,000 mL (0 mLs Intravenous Stopped 01/17/22 1044)  iohexol (OMNIPAQUE) 300 MG/ML solution 80 mL (80 mLs Intravenous Contrast Given 01/17/22 0928)  loperamide (IMODIUM) capsule 2 mg (2 mg Oral Given 01/17/22 1720)     IMPRESSION / MDM / ASSESSMENT AND PLAN / ED COURSE  I reviewed the triage vital signs and the nursing notes.                              Differential diagnosis includes, but is not limited to, ovarian cyst, ovarian torsion, acute appendicitis, diverticulitis, urinary tract infection/pyelonephritis, endometriosis, bowel obstruction, colitis, renal colic, gastroenteritis, hernia, fibroids, endometriosis, pregnancy related pain including ectopic pregnancy, etc.   The ED evaluation of sudden onset of nausea, vomiting, diarrhea, generalized abdominal pain, and some right lower quadrant discomfort, presents to the ED for evaluation of her symptoms.  Patient was evaluated for complaints in the ED, found to be initially slightly hypertensive and tachycardic.  She was evaluated with labs that did reveal a significant white count of 26.3 with a left shift.  Patient was further evaluated and found to have an initial critical lactic acid at 2.6.  Initial carrier fluids were initiated and patient was subsequently evaluated with CT imaging of the abdomen and pelvis with contrast, to evaluate for probable appendicitis.  Patient CT report was entirely negative and did not have any infectious or laboratory findings to correlate with  the patient's symptoms and her white count.  I reviewed the images against the report, and concurred with findings.  Subsequent evaluations with chest x-ray were negative for any acute findings, and ultrasound of the pelvic region given the right lower quad discomfort was also negative and reassuring, based on my review of images.  Patient's lactate normalized to 1.6, and vital signs did stabilize after fluid bolus.  Given the otherwise negative work-up, the patient's eventual diagnosis is likely an infectious etiology.  Patient is stable, reporting significant improvement of her symptoms patient did have another episode of diarrhea in the ED, and a stool sample was collected, which is pending at this time for culture and C. difficile evaluation.  Patient is at this time stable for outpatient management given she has been able to tolerate p.o. intake, and has no intractable vomiting or  diarrhea.  Patient will be discharged home with prescriptions for Zofran and loperamide.  He is given instructions on brat diet and fluid resuscitation.  Patient is to follow up with her PCP as needed or otherwise directed. Patient is given ED precautions to return to the ED for any worsening or new symptoms.   Clinical Course as of 01/18/22 0742  Wed Jan 17, 2022  0807 WBC(!): 26.3 Patient with RLQ/general ABD pain with NVD. WBCs 26.3 with left shift. CT abd/pelvis pending [JM]  0915 Lactic Acid, Venous(!!): 2.6 Elevated lactic; fluid bolus 1000 ml IVP Awaiting CT results [JM]  1616 US PELVIC COMPLETE W TRANSVAGINAL AND TORSION R/O [JM]    Clinical Course User Index [JM] Mayara Paulson, Dannielle Karvonen, PA-C     FINAL CLINICAL IMPRESSION(S) / ED DIAGNOSES   Final diagnoses:  Pelvic pain  Nausea vomiting and diarrhea  Gastroenteritis presumed infectious     Rx / DC Orders   ED Discharge Orders          Ordered    ondansetron (ZOFRAN-ODT) 4 MG disintegrating tablet  Every 8 hours PRN        01/17/22 1708     loperamide (IMODIUM A-D) 2 MG tablet  3 times daily PRN        01/17/22 1708             Note:  This document was prepared using Dragon voice recognition software and may include unintentional dictation errors.    Melvenia Needles, PA-C 01/18/22 [id number]    Delman Kitten, MD 01/18/22 [phone]

## 2022-01-17 NOTE — ED Notes (Signed)
Pt to ED for emesis and abdominal pain. Pt states that last night at 10pm she suddenly began vomiting with sudden cramping abdominal pain and diarrhea. States was up all night with vomiting and diarrhea. States chest and back hurt have sharp pain with deep inspiration. Accompanied by family member. PA at bedside.

## 2022-01-17 NOTE — ED Notes (Signed)
Lab called. Lactic is 2.6. EDP informed. 

## 2022-01-22 LAB — CULTURE, BLOOD (SINGLE)
Culture: NO GROWTH
Special Requests: ADEQUATE

## 2022-06-04 ENCOUNTER — Ambulatory Visit: Payer: Medicaid Other | Admitting: Obstetrics and Gynecology

## 2022-06-04 DIAGNOSIS — Z3491 Encounter for supervision of normal pregnancy, unspecified, first trimester: Secondary | ICD-10-CM

## 2022-06-04 DIAGNOSIS — Z3200 Encounter for pregnancy test, result unknown: Secondary | ICD-10-CM

## 2022-06-04 LAB — POCT URINE PREGNANCY: Preg Test, Ur: POSITIVE — AB

## 2022-06-04 NOTE — Progress Notes (Unsigned)
Pt presents for pregnancy confirmation nurse visit. UPT positive. Unknown LMP. Pt states this is her second pregnancy, no previous miscarriages or abortions (G2P1001). Dating scan order placed per protocol. Safe meds during pregnancy list given and explained to patient. Pt voiced no questions or concerns at this time and patient verbalized understanding of scheduling follow up appointments.

## 2022-06-08 ENCOUNTER — Ambulatory Visit (INDEPENDENT_AMBULATORY_CARE_PROVIDER_SITE_OTHER): Payer: Medicaid Other

## 2022-06-08 ENCOUNTER — Other Ambulatory Visit: Payer: Self-pay | Admitting: Obstetrics and Gynecology

## 2022-06-08 DIAGNOSIS — Z3491 Encounter for supervision of normal pregnancy, unspecified, first trimester: Secondary | ICD-10-CM | POA: Diagnosis not present

## 2022-06-14 ENCOUNTER — Ambulatory Visit (INDEPENDENT_AMBULATORY_CARE_PROVIDER_SITE_OTHER): Payer: Medicaid Other

## 2022-06-14 VITALS — Wt 119.0 lb

## 2022-06-14 DIAGNOSIS — Z3A00 Weeks of gestation of pregnancy not specified: Secondary | ICD-10-CM

## 2022-06-14 DIAGNOSIS — O09219 Supervision of pregnancy with history of pre-term labor, unspecified trimester: Secondary | ICD-10-CM

## 2022-06-14 DIAGNOSIS — O0990 Supervision of high risk pregnancy, unspecified, unspecified trimester: Secondary | ICD-10-CM | POA: Insufficient documentation

## 2022-06-14 NOTE — Progress Notes (Signed)
New OB Intake  I connected with  Hardie Pulley on 06/14/22 at  3:15 PM EDT by telephone Video Visit and verified that I am speaking with the correct person using two identifiers. Nurse is located at [facility] and pt is located at home.  I explained I am completing New OB Intake today. We discussed her EDD of 01/25/2023 that is based on LMP of unknown; per u/s done 06/08/22 was 7w that day. Pt is G2/P0101. I reviewed her allergies, medications, Medical/Surgical/OB history, and appropriate screenings. Based on history, this is a/an pregnancy complicated by preterm labor  .   Patient Active Problem List   Diagnosis Date Noted   Supervision of high risk pregnancy, antepartum 06/14/2022   Breakthrough bleeding on depo provera 08/29/2020   History of preterm premature rupture of membranes (PPROM) 05/12/2020   History of UTI 04/04/2020   History of marijuana use 04/04/2020   Anxiety and depression 04/04/2020   Vomiting    Diarrhea    Generalized abdominal pain     Concerns addressed today Pt asks if she can get a prenatal DNA test done; adv will have to ask; the last I heard they could only be done after delivery.  FOB isn't convinced the baby is his.  This is causing a lot of tension between them.  Delivery Plans:  Plans to deliver at [facility]  Anatomy US Explained first scheduled [place] will be around 20 weeks.   Labs Discussed genetic screening with patient. Patient desires genetic testing to be drawn with new OB labs. Discussed possible labs to be drawn at new OB appointment.  COVID Vaccine Patient has not had COVID vaccine.   Social Determinants of Health Food Insecurity: Pt admits to food insecurity.  Transportation: Patient denies transportation needs.  First visit review I reviewed new OB appt with pt. I explained she will have ob bloodwork and pap smear/pelvic exam if indicated. Explained pt will be seen by Doreene Burke, CNM at first visit; encounter  routed to appropriate provider.   Loran Senters, [facility] 06/14/2022  3:47 PM  Clinical Staff Provider  Office Location  [facility] Dating    Language  English Anatomy US    Flu Vaccine  offer Genetic Screen  NIPS:   TDaP vaccine   offer Hgb A1C or  GTT Early : Third trimester :   Covid declines   LAB RESULTS   Rhogam   Blood Type     Feeding Plan breast Antibody    Contraception pill Rubella    Circumcision yes RPR     Pediatrician  [facility] HBsAg     Support Person Amada Jupiter HIV    Prenatal Classes no Varicella     GBS  (For PCN allergy, check sensitivities)   BTL Consent  Hep C   VBAC Consent  Pap      Hgb Electro      CF      SMA

## 2022-06-20 ENCOUNTER — Telehealth: Payer: Self-pay

## 2022-06-20 NOTE — Telephone Encounter (Signed)
From our (pt and I) conversation last Thursday pm pt is wanting to have a prenatal DNA test done.  Is this possible?  I thought it was only done after delivery.  Thanks.

## 2022-06-25 ENCOUNTER — Other Ambulatory Visit: Payer: Medicaid Other

## 2022-06-25 DIAGNOSIS — O0990 Supervision of high risk pregnancy, unspecified, unspecified trimester: Secondary | ICD-10-CM

## 2022-06-26 LAB — CBC/D/PLT+RPR+RH+ABO+RUBIGG...
Antibody Screen: NEGATIVE
Basophils Absolute: 0.1 10*3/uL (ref 0.0–0.2)
Basos: 1 %
EOS (ABSOLUTE): 0.1 10*3/uL (ref 0.0–0.4)
Eos: 1 %
HCV Ab: NONREACTIVE
HIV Screen 4th Generation wRfx: NONREACTIVE
Hematocrit: 39.1 % (ref 34.0–46.6)
Hemoglobin: 13 g/dL (ref 11.1–15.9)
Hepatitis B Surface Ag: NEGATIVE
Immature Grans (Abs): 0 10*3/uL (ref 0.0–0.1)
Immature Granulocytes: 0 %
Lymphocytes Absolute: 1.4 10*3/uL (ref 0.7–3.1)
Lymphs: 14 %
MCH: 30.1 pg (ref 26.6–33.0)
MCHC: 33.2 g/dL (ref 31.5–35.7)
MCV: 91 fL (ref 79–97)
Monocytes Absolute: 0.5 10*3/uL (ref 0.1–0.9)
Monocytes: 6 %
Neutrophils Absolute: 7.3 10*3/uL — ABNORMAL HIGH (ref 1.4–7.0)
Neutrophils: 78 %
Platelets: 222 10*3/uL (ref 150–450)
RBC: 4.32 x10E6/uL (ref 3.77–5.28)
RDW: 12.5 % (ref 11.7–15.4)
RPR Ser Ql: NONREACTIVE
Rh Factor: POSITIVE
Rubella Antibodies, IGG: 1.9 index (ref >0.99)
Varicella zoster IgG: 183 index (ref >165)
WBC: 9.4 10*3/uL (ref 3.4–10.8)

## 2022-06-26 LAB — HCV INTERPRETATION

## 2022-06-26 LAB — URINALYSIS, ROUTINE W REFLEX MICROSCOPIC
Bilirubin, UA: NEGATIVE
Glucose, UA: NEGATIVE
Nitrite, UA: NEGATIVE
RBC, UA: NEGATIVE
Specific Gravity, UA: 1.023 (ref 1.005–1.030)
Urobilinogen, Ur: 0.2 mg/dL (ref 0.2–1.0)
pH, UA: 6.5 (ref 5.0–7.5)

## 2022-06-26 LAB — MICROSCOPIC EXAMINATION
Bacteria, UA: NONE SEEN
Casts: NONE SEEN /lpf

## 2022-06-27 LAB — URINE CULTURE, OB REFLEX

## 2022-06-27 LAB — GC/CHLAMYDIA PROBE AMP
Chlamydia trachomatis, NAA: NEGATIVE
Neisseria Gonorrhoeae by PCR: NEGATIVE

## 2022-06-27 LAB — CULTURE, OB URINE

## 2022-06-28 LAB — MATERNIT 21 PLUS CORE, BLOOD

## 2022-06-29 LAB — CANNABINOID (GC/MS), URINE
Cannabinoid: POSITIVE — AB
Carboxy THC (GC/MS): 638 ng/mL

## 2022-06-29 LAB — MONITOR DRUG PROFILE 14(MW)
Amphetamine Scrn, Ur: NEGATIVE ng/mL
BARBITURATE SCREEN URINE: NEGATIVE ng/mL
BENZODIAZEPINE SCREEN, URINE: NEGATIVE ng/mL
Buprenorphine, Urine: NEGATIVE ng/mL
Cocaine (Metab) Scrn, Ur: NEGATIVE ng/mL
Creatinine(Crt), U: 256.4 mg/dL (ref 20.0–300.0)
Fentanyl, Urine: NEGATIVE pg/mL
Meperidine Screen, Urine: NEGATIVE ng/mL
Methadone Screen, Urine: NEGATIVE ng/mL
OXYCODONE+OXYMORPHONE UR QL SCN: NEGATIVE ng/mL
Opiate Scrn, Ur: NEGATIVE ng/mL
Ph of Urine: 6.1 (ref 4.5–8.9)
Phencyclidine Qn, Ur: NEGATIVE ng/mL
Propoxyphene Scrn, Ur: NEGATIVE ng/mL
SPECIFIC GRAVITY: 1.029
Tramadol Screen, Urine: NEGATIVE ng/mL

## 2022-06-29 LAB — NICOTINE SCREEN, URINE: Cotinine Ql Scrn, Ur: NEGATIVE ng/mL

## 2022-07-12 ENCOUNTER — Ambulatory Visit (INDEPENDENT_AMBULATORY_CARE_PROVIDER_SITE_OTHER): Payer: Medicaid Other | Admitting: Certified Nurse Midwife

## 2022-07-12 ENCOUNTER — Encounter: Payer: Self-pay | Admitting: Certified Nurse Midwife

## 2022-07-12 VITALS — BP 99/64 | HR 76 | Wt 120.4 lb

## 2022-07-12 DIAGNOSIS — Z1379 Encounter for other screening for genetic and chromosomal anomalies: Secondary | ICD-10-CM

## 2022-07-12 MED ORDER — DOXYLAMINE-PYRIDOXINE 10-10 MG PO TBEC: 1.0000 | DELAYED_RELEASE_TABLET | Freq: Four times a day (QID) | ORAL | 5 refills | Status: DC

## 2022-07-12 MED ORDER — PRENATAL ADULT GUMMY/DHA/FA 0.4-25 MG PO CHEW
1.0000 | CHEWABLE_TABLET | Freq: Every day | ORAL | 11 refills | Status: DC
Start: 2022-07-12 — End: 2024-04-30

## 2022-07-12 NOTE — Progress Notes (Signed)
NEW OB HISTORY AND PHYSICAL  SUBJECTIVE:       Haley Robles is a 21 y.o. G56P0101 female, No LMP recorded (lmp unknown). Patient is pregnant., Estimated Date of Delivery: 01/25/23, [redacted]w[redacted]d, presents today for establishment of Prenatal Care. 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 Patient is pregnant. Normal Contraception: none Last Pap: 12/12/20. Results were: normal ( declines today, will have done PP)   Obstetric History OB History  Gravida Para Term Preterm AB Living  2 1   1   1   SAB IAB Ectopic Multiple Live Births        0 1    # Outcome Date GA Lbr Len/2nd Weight Sex Delivery Anes PTL Lv  2 Current           1 Preterm 05/12/20 [redacted]w[redacted]d 02:30 / 00:09 6 lb 2.4 oz (2.79 kg) M Vag-Spont None  LIV    Past Medical History:  Diagnosis Date   Abdominal pain, recurrent    Anxiety    Diarrhea    Vomiting     Past Surgical History:  Procedure Laterality Date   NO PAST SURGERIES      Current Outpatient Medications on File Prior to Visit  Medication Sig Dispense Refill   Meclizine HCl (BONINE) 25 MG CHEW Chew 1 tablet by mouth daily.     Prenatal Vit-Fe Fumarate-FA (MULTIVITAMIN-PRENATAL) 27-0.8 MG TABS tablet Take 1 tablet by mouth daily at 12 noon.     No current facility-administered medications on file prior to visit.    No Known Allergies  Social History   Socioeconomic History   Marital status: Significant Other    Spouse name: [redacted]w[redacted]d   Number of children: 1   Years of education: 12   Highest education level: Not on file  Occupational History   Occupation: stay at home mom  Tobacco Use   Smoking status: Never   Smokeless tobacco: Never  Vaping Use   Vaping Use: Never used  Substance and Sexual Activity   Alcohol use: Not Currently     Comment: occasionally   Drug use: Not Currently   Sexual activity: Yes    Partners: Male    Birth control/protection: Injection, None  Other Topics Concern   Not on file  Social History Narrative   6th grade   Social Determinants of Health   Financial Resource Strain: Medium Risk (06/14/2022)   Overall Financial Resource Strain (CARDIA)    Difficulty of Paying Living Expenses: Somewhat hard  Food Insecurity: Food Insecurity Present (06/14/2022)   Hunger Vital Sign    Worried About Running Out of Food in the Last Year: Sometimes true    Ran Out of Food in the Last Year: Sometimes true  Transportation Needs: No Transportation Needs (06/14/2022)   PRAPARE - 06/16/2022 (Medical): No    Lack of Transportation (Non-Medical): No  Physical Activity: Insufficiently Active (06/14/2022)   Exercise Vital Sign    Days of Exercise per Week: 2 days    Minutes of Exercise per Session: 20 min  Stress: Stress Concern Present (06/14/2022)   06/16/2022 of Occupational Health - Occupational Stress Questionnaire    Feeling of Stress : To some extent  Social Connections: Unknown (06/14/2022)  Social Connection and Isolation Panel [NHANES]    Frequency of Communication with Friends and Family: More than three times a week    Frequency of Social Gatherings with Friends and Family: Three times a week    Attends Religious Services: 1 to 4 times per year    Active Member of Clubs or Organizations: No    Attends Banker Meetings: Never    Marital Status: Not on file  Intimate Partner Violence: Not At Risk (06/14/2022)   Humiliation, Afraid, Rape, and Kick questionnaire    Fear of Current or Ex-Partner: No    Emotionally Abused: No    Physically Abused: No    Sexually Abused: No    Family History  Problem Relation Age of Onset   Diabetes Mother        prediabetic   Cancer Father 4       testicular   Pancreatitis Father    Cholelithiasis Father     Ulcers Father    Ulcers Maternal Grandmother    Cancer Maternal Grandfather        mouth   Diabetes Maternal Grandfather    Cholelithiasis Maternal Grandfather    Aneurysm Paternal Grandmother        brain   Cancer Paternal Grandfather        lung    The following portions of the patient's history were reviewed and updated as appropriate: allergies, current medications, past OB history, past medical history, past surgical history, past family history, past social history, and problem list.    OBJECTIVE: Initial Physical Exam (New OB)  GENERAL APPEARANCE: alert, well appearing, in no apparent distress, oriented to person, place and time HEAD: normocephalic, atraumatic MOUTH: mucous membranes moist, pharynx normal without lesions THYROID: no thyromegaly or masses present BREASTS: no masses noted, no significant tenderness, no palpable axillary nodes, no skin changes LUNGS: clear to auscultation, no wheezes, rales or rhonchi, symmetric air entry HEART: regular rate and rhythm, no murmurs ABDOMEN: soft, nontender, nondistended, no abnormal masses, no epigastric pain and FHT present EXTREMITIES: no redness or tenderness in the calves or thighs SKIN: normal coloration and turgor, no rashes LYMPH NODES: no adenopathy palpable NEUROLOGIC: alert, oriented, normal speech, no focal findings or movement disorder noted  PELVIC EXAM deferred, not due for pap, tested pelvis  ASSESSMENT: Normal pregnancy  PLAN: Prenatal care See orders New OB counseling: The patient has been given an overview regarding routine prenatal care. Recommendations regarding diet, weight gain, and exercise in pregnancy were given. Prenatal testing, optional genetic testing, and ultrasound use in pregnancy were reviewed. Maternit 21 today. Benefits of Breast Feeding were discussed. The patient is encouraged to consider nursing her baby post partum. States she may pump. Counseled on cessation of marijuana. Follow up 4  wk for ROB.  Doreene Burke, CNM

## 2022-07-12 NOTE — Patient Instructions (Signed)
Prenatal Care Prenatal care is health care during pregnancy. It helps you and your unborn baby (fetus) stay as healthy as possible. Prenatal care may be provided by a midwife, a family practice doctor, a mid-level practitioner (nurse practitioner or physician assistant), or a childbirth and pregnancy doctor (obstetrician). How does this affect me? During pregnancy, you will be closely monitored for any new conditions that might develop. To lower your risk of pregnancy complications, you and your health care provider will talk about any underlying conditions you have. How does this affect my baby? Early and consistent prenatal care increases the chance that your baby will be healthy during pregnancy. Prenatal care lowers the risk that your baby will be: Born early (prematurely). Smaller than expected at birth (small for gestational age). What can I expect at the first prenatal care visit? Your first prenatal care visit will likely be the longest. You should schedule your first prenatal care visit as soon as you know that you are pregnant. Your first visit is a good time to talk about any questions or concerns you have about pregnancy. Medical history At your visit, you and your health care provider will talk about your medical history, including: Any past pregnancies. Your family's medical history. Medical history of the baby's father. Any long-term (chronic) health conditions you have and how you manage them. Any surgeries or procedures you have had. Any current over-the-counter or prescription medicines, herbs, or supplements that you are taking. Other factors that could pose a risk to your baby, including: Exposure to harmful chemicals or radiation at work or at home. Any substance use, including tobacco, alcohol, and drug use. Your home setting and your stress levels, including: Exposure to abuse or violence. Household financial strain. Your daily health habits, including diet and  exercise. Tests and screenings Your health care provider will: Measure your weight, height, and blood pressure. Do a physical exam, including a pelvic and breast exam. Perform blood tests and urine tests to check for: Urinary tract infection. Sexually transmitted infections (STIs). Low iron levels in your blood (anemia). Blood type and certain proteins on red blood cells (Rh antibodies). Infections and immunity to viruses, such as hepatitis B and rubella. HIV (human immunodeficiency virus). Discuss your options for genetic screening. Tips about staying healthy Your health care provider will also give you information about how to keep yourself and your baby healthy, including: Nutrition and taking vitamins. Physical activity. How to manage pregnancy symptoms such as nausea and vomiting (morning sickness). Infections and substances that may be harmful to your baby and how to avoid them. Food safety. Dental care. Working. Travel. Warning signs to watch for and when to call your health care provider. How often will I have prenatal care visits? After your first prenatal care visit, you will have regular visits throughout your pregnancy. The visit schedule is often as follows: Up to week 28 of pregnancy: once every 4 weeks. 28-36 weeks: once every 2 weeks. After 36 weeks: every week until delivery. Some women may have visits more or less often depending on any underlying health conditions and the health of the baby. Keep all follow-up and prenatal care visits. This is important. What happens during routine prenatal care visits? Your health care provider will: Measure your weight and blood pressure. Check for fetal heart sounds. Measure the height of your uterus in your abdomen (fundal height). This may be measured starting around week 20 of pregnancy. Check the position of your baby inside your uterus. Ask questions   about your diet, sleeping patterns, and whether you can feel the baby  move. Review warning signs to watch for and signs of labor. Ask about any pregnancy symptoms you are having and how you are dealing with them. Symptoms may include: Headaches. Nausea and vomiting. Vaginal discharge. Swelling. Fatigue. Constipation. Changes in your vision. Feeling persistently sad or anxious. Any discomfort, including back or pelvic pain. Bleeding or spotting. Make a list of questions to ask your health care provider at your routine visits. What tests might I have during prenatal care visits? You may have blood, urine, and imaging tests throughout your pregnancy, such as: Urine tests to check for glucose, protein, or signs of infection. Glucose tests to check for a form of diabetes that can develop during pregnancy (gestational diabetes mellitus). This is usually done around week 24 of pregnancy. Ultrasounds to check your baby's growth and development, to check for birth defects, and to check your baby's well-being. These can also help to decide when you should deliver your baby. A test to check for group B strep (GBS) infection. This is usually done around week 36 of pregnancy. Genetic testing. This may include blood, fluid, or tissue sampling, or imaging tests, such as an ultrasound. Some genetic tests are done during the first trimester and some are done during the second trimester. What else can I expect during prenatal care visits? Your health care provider may recommend getting certain vaccines during pregnancy. These may include: A yearly flu shot (annual influenza vaccine). This is especially important if you will be pregnant during flu season. Tdap (tetanus, diphtheria, pertussis) vaccine. Getting this vaccine during pregnancy can protect your baby from whooping cough (pertussis) after birth. This vaccine may be recommended between weeks 27 and 36 of pregnancy. A COVID-19 vaccine. Later in your pregnancy, your health care provider may give you information  about: Childbirth and breastfeeding classes. Choosing a health care provider for your baby. Umbilical cord banking. Breastfeeding. Birth control after your baby is born. The hospital labor and delivery unit and how to set up a tour. Registering at the hospital before you go into labor. Where to find more information Office on Women's Health: womenshealth.gov American Pregnancy Association: americanpregnancy.org March of Dimes: marchofdimes.org Summary Prenatal care helps you and your baby stay as healthy as possible during pregnancy. Your first prenatal care visit will most likely be the longest. You will have visits and tests throughout your pregnancy to monitor your health and your baby's health. Bring a list of questions to your visits to ask your health care provider. Make sure to keep all follow-up and prenatal care visits. This information is not intended to replace advice given to you by your health care provider. Make sure you discuss any questions you have with your health care provider. Document Revised: 09/15/2020 Document Reviewed: 09/15/2020 Elsevier Patient Education  2023 Elsevier Inc.  

## 2022-07-13 LAB — CBC
Hematocrit: 32.7 % — ABNORMAL LOW (ref 34.0–46.6)
Hemoglobin: 11.4 g/dL (ref 11.1–15.9)
MCH: 31.1 pg (ref 26.6–33.0)
MCHC: 34.9 g/dL (ref 31.5–35.7)
MCV: 89 fL (ref 79–97)
Platelets: 187 10*3/uL (ref 150–450)
RBC: 3.66 x10E6/uL — ABNORMAL LOW (ref 3.77–5.28)
RDW: 12.4 % (ref 11.7–15.4)
WBC: 7.8 10*3/uL (ref 3.4–10.8)

## 2022-07-18 LAB — MATERNIT21  PLUS CORE+ESS+SCA, BLOOD
11q23 deletion (Jacobsen): NOT DETECTED
15q11 deletion (PW Angelman): NOT DETECTED
1p36 deletion syndrome: NOT DETECTED
22q11 deletion (DiGeorge): NOT DETECTED
4p16 deletion(Wolf-Hirschhorn): NOT DETECTED
5p15 deletion (Cri-du-chat): NOT DETECTED
8q24 deletion (Langer-Giedion): NOT DETECTED
Fetal Fraction: 10
Monosomy X (Turner Syndrome): NOT DETECTED
Result (T21): NEGATIVE
Trisomy 13 (Patau syndrome): NEGATIVE
Trisomy 16: NOT DETECTED
Trisomy 18 (Edwards syndrome): NEGATIVE
Trisomy 21 (Down syndrome): NEGATIVE
Trisomy 22: NOT DETECTED
XXX (Triple X Syndrome): NOT DETECTED
XXY (Klinefelter Syndrome): NOT DETECTED
XYY (Jacobs Syndrome): NOT DETECTED

## 2022-08-07 ENCOUNTER — Ambulatory Visit (INDEPENDENT_AMBULATORY_CARE_PROVIDER_SITE_OTHER): Payer: Medicaid Other | Admitting: Obstetrics

## 2022-08-07 VITALS — BP 98/61 | HR 75 | Wt 123.5 lb

## 2022-08-07 DIAGNOSIS — Z3480 Encounter for supervision of other normal pregnancy, unspecified trimester: Secondary | ICD-10-CM | POA: Insufficient documentation

## 2022-08-07 DIAGNOSIS — Z3482 Encounter for supervision of other normal pregnancy, second trimester: Secondary | ICD-10-CM

## 2022-08-07 DIAGNOSIS — Z3A15 15 weeks gestation of pregnancy: Secondary | ICD-10-CM

## 2022-08-07 LAB — POCT URINALYSIS DIPSTICK OB
Blood, UA: NEGATIVE
Glucose, UA: NEGATIVE
Leukocytes, UA: NEGATIVE
Nitrite, UA: NEGATIVE
Spec Grav, UA: 1.01 (ref 1.010–1.025)
Urobilinogen, UA: 0.2 E.U./dL
pH, UA: 6.5 (ref 5.0–8.0)

## 2022-08-07 NOTE — Progress Notes (Signed)
ROB. Patient states  Patient states no questions or concerns at this time.   

## 2022-08-07 NOTE — Progress Notes (Signed)
ROB at [redacted]w[redacted]d. Starting to feel flutters. Joellyn's nausea is improving. She's eating well and taking her PNV. Having some round ligament pain. Reviewed previous birth - precipitous birth about 30 min after arrival to hospital. Anticipatory guidance about second trimester. Will schedule anatomy US and ROB in 4-5 weeks.  Guadlupe Spanish, CNM

## 2022-09-04 ENCOUNTER — Encounter: Payer: Self-pay | Admitting: Certified Nurse Midwife

## 2022-09-04 ENCOUNTER — Ambulatory Visit (INDEPENDENT_AMBULATORY_CARE_PROVIDER_SITE_OTHER): Payer: Medicaid Other

## 2022-09-04 ENCOUNTER — Ambulatory Visit (INDEPENDENT_AMBULATORY_CARE_PROVIDER_SITE_OTHER): Payer: Medicaid Other | Admitting: Certified Nurse Midwife

## 2022-09-04 VITALS — BP 105/70 | HR 79 | Wt 126.9 lb

## 2022-09-04 DIAGNOSIS — Z3A19 19 weeks gestation of pregnancy: Secondary | ICD-10-CM | POA: Diagnosis not present

## 2022-09-04 DIAGNOSIS — Z3A15 15 weeks gestation of pregnancy: Secondary | ICD-10-CM | POA: Diagnosis not present

## 2022-09-04 DIAGNOSIS — Z3402 Encounter for supervision of normal first pregnancy, second trimester: Secondary | ICD-10-CM

## 2022-09-04 LAB — POCT URINALYSIS DIPSTICK OB
Bilirubin, UA: NEGATIVE
Blood, UA: NEGATIVE
Glucose, UA: NEGATIVE
Ketones, UA: NEGATIVE
Leukocytes, UA: NEGATIVE
Nitrite, UA: NEGATIVE
POC,PROTEIN,UA: NEGATIVE
Spec Grav, UA: 1.01 (ref 1.010–1.025)
Urobilinogen, UA: 0.2 E.U./dL
pH, UA: 6.5 (ref 5.0–8.0)

## 2022-09-04 NOTE — Progress Notes (Signed)
ROB doing well, U/s for anatomy today . Results reviewed. Round ligament pain discussed , self help discussed. Follow up 4 wks for ROB.   Philip Aspen, CNM   Patient Name: Haley Robles DOB: [birth year] MRN: [id number]  ULTRASOUND REPORT  Location: [facility] Date of Service: 09/04/2022   Indications:Anatomy Ultrasound Findings:  Haley Robles intrauterine pregnancy is visualized with FHR at 147 BPM.   Biometrics gives an (U/S) Gestational age of [redacted]w[redacted]d and an (U/S) EDD of 01/27/2023; this correlates with the clinically established Estimated Date of Delivery: 01/25/23   Fetal presentation is Cephalic.  EFW: 287grams (10oz) Placenta: posterior. AFI: subjectively normal.  Anatomic survey is complete and appears normal Gender - female.   Previous child had bilateral polydactyly (both hands appear wnl)  Cervix appears long and closed.  Right Ovary: is normal in appearance. Left Ovary: is normal appearance. Survey of the adnexa demonstrates no adnexal masses.  There is no free peritoneal fluid in the cul de sac.  Impression: 1. [redacted]w[redacted]d Viable Singleton Intrauterine pregnancy by U/S. 2. (U/S) EDD is consistent with Clinically established Estimated Date of Delivery: 01/25/23 . 3. Normal Anatomy Scan  Recommendations: 1.Clinical correlation with the patient's History and Physical Exam.  Edwena Bunde, RDMS, RVT

## 2022-09-04 NOTE — Patient Instructions (Signed)
Round Ligament Pain  The round ligaments are a pair of cord-like tissues that help support the uterus. They can become a source of pain during pregnancy as the ligaments soften and stretch as the baby grows. The pain usually begins in the second trimester (13-28 weeks) of pregnancy, and should only last for a few seconds when it occurs. However, the pain can come and go until the baby is delivered. The pain does not cause harm to the baby. Round ligament pain is usually a short, sharp, and pinching pain, but it can also be a dull, lingering, and aching pain. The pain is felt in the lower side of the abdomen or in the groin. It usually starts deep in the groin and moves up to the outside of the hip area. The pain may happen when you: Suddenly change position, such as quickly going from a sitting to standing position. Do physical activity. Cough or sneeze. Follow these instructions at home: Managing pain  When the pain starts, relax. Then, try any of these methods to help with the pain: Sit down. Flex your knees up to your abdomen. Lie on your side with one pillow under your abdomen and another pillow between your legs. Sit in a warm bath for 15-20 minutes or until the pain goes away. General instructions Watch your condition for any changes. Move slowly when you sit down or stand up. Stop or reduce your physical activities if they cause pain. Avoid long walks if they cause pain. Take over-the-counter and prescription medicines only as told by your health care provider. Keep all follow-up visits. This is important. Contact a health care provider if: Your pain does not go away with treatment. You feel pain in your back that you did not have before. Your medicine is not helping. You have a fever or chills. You have nausea or vomiting. You have diarrhea. You have pain when you urinate. Get help right away if: You have pain that is a rhythmic, cramping pain similar to labor pains. Labor  pains are usually 2 minutes apart, last for about 1 minute, and involve a bearing down feeling or pressure in your pelvis. You have vaginal bleeding. These symptoms may represent a serious problem that is an emergency. Do not wait to see if the symptoms will go away. Get medical help right away. Call your local emergency services (911 in the [place] Do not drive yourself to the hospital. Summary Round ligament pain is felt in the lower abdomen or groin. This pain usually begins in the second trimester (13-28 weeks) and should only last for a few seconds when it occurs. You may notice the pain when you suddenly change position, when you cough or sneeze, or during physical activity. Relaxing, flexing your knees to your abdomen, lying on one side, or taking a warm bath may help to get rid of the pain. Contact your health care provider if the pain does not go away. This information is not intended to replace advice given to you by your health care provider. Make sure you discuss any questions you have with your health care provider. Document Revised: 02/15/2021 Document Reviewed: 02/15/2021 Elsevier Patient Education  2023 Elsevier Inc.  

## 2022-10-03 ENCOUNTER — Encounter: Payer: Self-pay | Admitting: Licensed Practical Nurse

## 2022-10-03 ENCOUNTER — Ambulatory Visit (INDEPENDENT_AMBULATORY_CARE_PROVIDER_SITE_OTHER): Payer: Medicaid Other | Admitting: Licensed Practical Nurse

## 2022-10-03 VITALS — BP 103/62 | HR 80 | Wt 136.0 lb

## 2022-10-03 DIAGNOSIS — Z3480 Encounter for supervision of other normal pregnancy, unspecified trimester: Secondary | ICD-10-CM

## 2022-10-03 DIAGNOSIS — Z131 Encounter for screening for diabetes mellitus: Secondary | ICD-10-CM

## 2022-10-03 DIAGNOSIS — O09292 Supervision of pregnancy with other poor reproductive or obstetric history, second trimester: Secondary | ICD-10-CM

## 2022-10-03 DIAGNOSIS — Z3A23 23 weeks gestation of pregnancy: Secondary | ICD-10-CM

## 2022-10-03 LAB — POCT URINALYSIS DIPSTICK OB
Bilirubin, UA: NEGATIVE
Blood, UA: NEGATIVE
Glucose, UA: NEGATIVE
Ketones, UA: NEGATIVE
Leukocytes, UA: NEGATIVE
Nitrite, UA: NEGATIVE
POC,PROTEIN,UA: NEGATIVE
Spec Grav, UA: 1.01 (ref 1.010–1.025)
Urobilinogen, UA: 0.2 E.U./dL
pH, UA: 6.5 (ref 5.0–8.0)

## 2022-10-03 NOTE — Progress Notes (Signed)
Routine Prenatal Care Visit  Subjective  Haley Robles is a 21 y.o. G2P0101 at [redacted]w[redacted]d being seen today for ongoing prenatal care.  She is currently monitored for the following issues for this low-risk pregnancy and has Vomiting; History of UTI; History of marijuana use; Anxiety and depression; History of preterm premature rupture of membranes (PPROM); Supervision of high risk pregnancy, antepartum; and Supervision of other normal pregnancy, antepartum on their problem list.  ----------------------------------------------------------------------------------- Patient reports no complaints.  Doing well. Feels pretty good. Sometimes feels stressed out about normal things like house work etc.  Hopes to get an epidural with this labor. Plans to try to pump and give EBM-is getting a pump.  Reviewed GDM screening for next apt  Contractions: Not present. Vag. Bleeding: None.  Movement: Present. Leaking Fluid denies.  ----------------------------------------------------------------------------------- The following portions of the patient's history were reviewed and updated as appropriate: allergies, current medications, past family history, past medical history, past social history, past surgical history and problem list. Problem list updated.  Objective  Blood pressure 103/62, pulse 80, weight 136 lb (61.7 kg). Pregravid weight 115 lb (52.2 kg) Total Weight Gain 21 lb (9.526 kg) Urinalysis: Urine Protein Negative  Urine Glucose Negative  Fetal Status: Fetal Heart Rate (bpm): 140 Fundal Height: 24 cm Movement: Present     General:  Alert, oriented and cooperative. Patient is in no acute distress.  Skin: Skin is warm and dry. No rash noted.   Cardiovascular: Normal heart rate noted  Respiratory: Normal respiratory effort, no problems with respiration noted  Abdomen: Soft, gravid, appropriate for gestational age. Pain/Pressure: Absent     Pelvic:  Cervical exam deferred        Extremities: Normal range  of motion.     Mental Status: Normal mood and affect. Normal behavior. Normal judgment and thought content.   Assessment   21 y.o. G2P0101 at [redacted]w[redacted]d by  01/25/2023, by Ultrasound presenting for routine prenatal visit  Plan   g2 Problems (from 06/04/22 to present)     Problem Noted Resolved   Supervision of high risk pregnancy, antepartum 06/14/2022 by Cleophas Dunker, CMA No        Preterm labor symptoms and general obstetric precautions including but not limited to vaginal bleeding, contractions, leaking of fluid and fetal movement were reviewed in detail with the patient. Please refer to After Visit Summary for other counseling recommendations.   Return in about 4 weeks (around 10/31/2022) for ROB, 28 wk labs.  Roberto Scales, [facility]  10/03/22  3:30 PM

## 2022-10-31 ENCOUNTER — Encounter: Payer: Self-pay | Admitting: Licensed Practical Nurse

## 2022-10-31 ENCOUNTER — Other Ambulatory Visit: Payer: Medicaid Other

## 2022-10-31 ENCOUNTER — Ambulatory Visit (INDEPENDENT_AMBULATORY_CARE_PROVIDER_SITE_OTHER): Payer: Medicaid Other | Admitting: Licensed Practical Nurse

## 2022-10-31 VITALS — BP 105/54 | HR 78 | Wt 138.0 lb

## 2022-10-31 DIAGNOSIS — Z3A27 27 weeks gestation of pregnancy: Secondary | ICD-10-CM

## 2022-10-31 DIAGNOSIS — Z131 Encounter for screening for diabetes mellitus: Secondary | ICD-10-CM

## 2022-10-31 DIAGNOSIS — O09292 Supervision of pregnancy with other poor reproductive or obstetric history, second trimester: Secondary | ICD-10-CM

## 2022-10-31 DIAGNOSIS — Z3480 Encounter for supervision of other normal pregnancy, unspecified trimester: Secondary | ICD-10-CM

## 2022-10-31 LAB — POCT URINALYSIS DIPSTICK
Bilirubin, UA: NEGATIVE
Blood, UA: NEGATIVE
Glucose, UA: NEGATIVE
Ketones, UA: NEGATIVE
Nitrite, UA: NEGATIVE
Protein, UA: NEGATIVE
Spec Grav, UA: 1.01
Urobilinogen, UA: 0.2 U/dL
pH, UA: 6

## 2022-10-31 NOTE — Progress Notes (Signed)
Routine Prenatal Care Visit  Subjective  Haley Robles is a 21 y.o. G2P0101 at [redacted]w[redacted]d being seen today for ongoing prenatal care.  She is currently monitored for the following issues for this low-risk pregnancy and has Vomiting; History of UTI; History of marijuana use; Anxiety and depression; History of preterm premature rupture of membranes (PPROM); Supervision of high risk pregnancy, antepartum; and Supervision of other normal pregnancy, antepartum on their problem list.  ----------------------------------------------------------------------------------- Patient reports no complaints.  Doing well.  Has some worried about labor, her first labor was so fast she was not able to get an epidural-on review of her birth she did spend a lot of time at home with "stomach pains" before coming in, Encouraged pt to visualize the birth she wants, she may come to the hospital sooner than expected rather than waiting at home "until it is too late". Also encouraged pt to talk with partner about her fears and expectations.  -they will have some family available to watch their son but they are at least 20-30 mins away.  Contractions: Irritability. Vag. Bleeding: None.  Movement: Present. Leaking Fluid denies.  ----------------------------------------------------------------------------------- The following portions of the patient's history were reviewed and updated as appropriate: allergies, current medications, past family history, past medical history, past social history, past surgical history and problem list. Problem list updated.  Objective  Blood pressure (!) 105/54, pulse 78, weight 138 lb (62.6 kg). Pregravid weight 115 lb (52.2 kg) Total Weight Gain 23 lb (10.4 kg) Urinalysis: Urine Protein    Urine Glucose    Fetal Status: Fetal Heart Rate (bpm): 150 Fundal Height: 26 cm Movement: Present     General:  Alert, oriented and cooperative. Patient is in no acute distress.  Skin: Skin is warm and dry. No  rash noted.   Cardiovascular: Normal heart rate noted  Respiratory: Normal respiratory effort, no problems with respiration noted  Abdomen: Soft, gravid, appropriate for gestational age. Pain/Pressure: Present     Pelvic:  Cervical exam deferred        Extremities: Normal range of motion.  Edema: None  Mental Status: Normal mood and affect. Normal behavior. Normal judgment and thought content.   Assessment   21 y.o. G2P0101 at [redacted]w[redacted]d by  01/25/2023, by Ultrasound presenting for routine prenatal visit  Plan   g2 Problems (from 06/04/22 to present)     Problem Noted Resolved   Supervision of high risk pregnancy, antepartum 06/14/2022 by Loran Senters, CMA No        Preterm labor symptoms and general obstetric precautions including but not limited to vaginal bleeding, contractions, leaking of fluid and fetal movement were reviewed in detail with the patient. Please refer to After Visit Summary for other counseling recommendations.   Return in about 2 weeks (around 11/14/2022) for ROB.  28 wk labs collected  Carie Caddy, [place]   [facility]  10/31/22  9:37 AM

## 2022-11-01 LAB — 28 WEEK RH+PANEL
Basophils Absolute: 0 10*3/uL (ref 0.0–0.2)
Basos: 0 %
EOS (ABSOLUTE): 0.2 10*3/uL (ref 0.0–0.4)
Eos: 1 %
Gestational Diabetes Screen: 101 mg/dL (ref 70–139)
HIV Screen 4th Generation wRfx: NONREACTIVE
Hematocrit: 31.9 % — ABNORMAL LOW (ref 34.0–46.6)
Hemoglobin: 11 g/dL — ABNORMAL LOW (ref 11.1–15.9)
Immature Grans (Abs): 0.1 10*3/uL (ref 0.0–0.1)
Immature Granulocytes: 1 %
Lymphocytes Absolute: 1.5 10*3/uL (ref 0.7–3.1)
Lymphs: 13 %
MCH: 31.4 pg (ref 26.6–33.0)
MCHC: 34.5 g/dL (ref 31.5–35.7)
MCV: 91 fL (ref 79–97)
Monocytes Absolute: 0.6 10*3/uL (ref 0.1–0.9)
Monocytes: 5 %
Neutrophils Absolute: 9.1 10*3/uL — ABNORMAL HIGH (ref 1.4–7.0)
Neutrophils: 80 %
Platelets: 197 10*3/uL (ref 150–450)
RBC: 3.5 x10E6/uL — ABNORMAL LOW (ref 3.77–5.28)
RDW: 11.9 % (ref 11.7–15.4)
RPR Ser Ql: NONREACTIVE
WBC: 11.4 10*3/uL — ABNORMAL HIGH (ref 3.4–10.8)

## 2022-11-03 LAB — URINE CULTURE

## 2022-11-14 ENCOUNTER — Encounter: Payer: Medicaid Other | Admitting: Advanced Practice Midwife

## 2022-11-23 ENCOUNTER — Encounter: Payer: Self-pay | Admitting: Medical

## 2022-11-23 ENCOUNTER — Ambulatory Visit (INDEPENDENT_AMBULATORY_CARE_PROVIDER_SITE_OTHER): Payer: Medicaid Other | Admitting: Medical

## 2022-11-23 VITALS — BP 96/66 | HR 101 | Wt 139.0 lb

## 2022-11-23 DIAGNOSIS — Z3A31 31 weeks gestation of pregnancy: Secondary | ICD-10-CM

## 2022-11-23 DIAGNOSIS — Z3483 Encounter for supervision of other normal pregnancy, third trimester: Secondary | ICD-10-CM

## 2022-11-23 DIAGNOSIS — Z23 Encounter for immunization: Secondary | ICD-10-CM | POA: Diagnosis not present

## 2022-11-23 DIAGNOSIS — O0990 Supervision of high risk pregnancy, unspecified, unspecified trimester: Secondary | ICD-10-CM

## 2022-11-23 DIAGNOSIS — Z3480 Encounter for supervision of other normal pregnancy, unspecified trimester: Secondary | ICD-10-CM

## 2022-11-23 LAB — POCT URINALYSIS DIPSTICK OB
Bilirubin, UA: NEGATIVE
Blood, UA: NEGATIVE
Glucose, UA: NEGATIVE
Leukocytes, UA: NEGATIVE
Nitrite, UA: NEGATIVE
POC,PROTEIN,UA: NEGATIVE
Spec Grav, UA: 1.01 (ref 1.010–1.025)
Urobilinogen, UA: 1 E.U./dL
pH, UA: 5.5 (ref 5.0–8.0)

## 2022-11-23 NOTE — Progress Notes (Signed)
   PRENATAL VISIT NOTE  Subjective:  Haley Robles is a 21 y.o. G2P0101 at [redacted]w[redacted]d being seen today for ongoing prenatal care.  She is currently monitored for the following issues for this high-risk pregnancy and has Vomiting; History of UTI; History of marijuana use; Anxiety and depression; History of preterm premature rupture of membranes (PPROM); and Supervision of high risk pregnancy, antepartum on their problem list.  Patient reports fatigue.  Contractions: Irritability. Vag. Bleeding: None.  Movement: Present. Denies leaking of fluid.   The following portions of the patient's history were reviewed and updated as appropriate: allergies, current medications, past family history, past medical history, past social history, past surgical history and problem list.   Objective:   Vitals:   11/23/22 1430  BP: 96/66  Pulse: (!) 101  Weight: 139 lb (63 kg)    Fetal Status:     Movement: Present     General:  Alert, oriented and cooperative. Patient is in no acute distress.  Skin: Skin is warm and dry. No rash noted.   Cardiovascular: Normal heart rate noted  Respiratory: Normal respiratory effort, no problems with respiration noted  Abdomen: Soft, gravid, appropriate for gestational age.  Pain/Pressure: Present     Pelvic: Cervical exam deferred        Extremities: Normal range of motion.     Mental Status: Normal mood and affect. Normal behavior. Normal judgment and thought content.   Assessment and Plan:  Pregnancy: G2P0101 at [redacted]w[redacted]d 1. Need for Tdap vaccination - Tdap vaccine greater than or equal to 7yo IM  2. Supervision of other normal pregnancy, antepartum - POC Urinalysis Dipstick OB - 28 week labs reviewed   3. Need for immunization against influenza - Flu Vaccine QUAD 55mo+IM (Fluarix, Fluzone & Alfiuria Quad PF)  4. Supervision of high risk pregnancy, antepartum  Preterm labor symptoms and general obstetric precautions including but not limited to vaginal bleeding,  contractions, leaking of fluid and fetal movement were reviewed in detail with the patient. Please refer to After Visit Summary for other counseling recommendations.   Return in about 2 weeks (around 12/07/2022) for [facility] APP.  No future appointments.  Vonzella Nipple, PA-C

## 2022-11-26 ENCOUNTER — Encounter: Payer: Self-pay | Admitting: Medical

## 2022-12-05 ENCOUNTER — Ambulatory Visit: Payer: Medicaid Other | Admitting: Obstetrics and Gynecology

## 2022-12-07 ENCOUNTER — Encounter: Payer: Medicaid Other | Admitting: Obstetrics and Gynecology

## 2022-12-07 DIAGNOSIS — Z3A33 33 weeks gestation of pregnancy: Secondary | ICD-10-CM

## 2022-12-07 DIAGNOSIS — Z3483 Encounter for supervision of other normal pregnancy, third trimester: Secondary | ICD-10-CM

## 2022-12-17 NOTE — L&D Delivery Note (Signed)
Date of delivery: 01/16/2023 Estimated Date of Delivery: 01/25/23 No LMP recorded (lmp unknown). EGA: [redacted]w[redacted]d  Delivery Note At 3:43 AM a viable female was delivered via Vaginal, Spontaneous  Presentation:  Occiput Anterior, LOA   APGAR: 8, 9;     Weight:  2690 g, 5 pounds 15 ounces Placenta status: Spontaneous, Intact.   Cord: 3 vessels with the following complications: None.  Cord pH: NA  Patient wanting epidural, discussed breaking water and likely having a baby prior to epidural placement. She was agreeable and pushed for 6 minutes following AROM/clear fluid to deliver a viable female infant.  The head followed by shoulders, which delivered without difficulty, and the rest of the body.  No nuchal cord noted.  Baby to mom's chest.  Cord clamped and cut after 4 min delay.  Cord blood obtained.  Placenta delivered spontaneously, intact, with a 3-vessel cord.   All counts correct.  Hemostasis obtained with IV pitocin and fundal massage.     Anesthesia: None Episiotomy: None Lacerations: None Suture Repair:  NA Est. Blood Loss (mL): 255  Mom to postpartum.  Baby to Couplet care / Skin to Skin.  Rod Can, CNM 01/16/2023, 4:14 AM

## 2022-12-20 ENCOUNTER — Ambulatory Visit (INDEPENDENT_AMBULATORY_CARE_PROVIDER_SITE_OTHER): Payer: Medicaid Other | Admitting: Obstetrics and Gynecology

## 2022-12-20 VITALS — BP 107/67 | HR 97 | Wt 142.5 lb

## 2022-12-20 DIAGNOSIS — O0993 Supervision of high risk pregnancy, unspecified, third trimester: Secondary | ICD-10-CM

## 2022-12-20 DIAGNOSIS — O09293 Supervision of pregnancy with other poor reproductive or obstetric history, third trimester: Secondary | ICD-10-CM

## 2022-12-20 DIAGNOSIS — Z3A34 34 weeks gestation of pregnancy: Secondary | ICD-10-CM

## 2022-12-20 DIAGNOSIS — Z8759 Personal history of other complications of pregnancy, childbirth and the puerperium: Secondary | ICD-10-CM

## 2022-12-20 DIAGNOSIS — O26843 Uterine size-date discrepancy, third trimester: Secondary | ICD-10-CM

## 2022-12-20 LAB — POCT URINALYSIS DIPSTICK OB
Bilirubin, UA: NEGATIVE
Blood, UA: NEGATIVE
Glucose, UA: NEGATIVE
Ketones, UA: NEGATIVE
Leukocytes, UA: NEGATIVE
Nitrite, UA: NEGATIVE
POC,PROTEIN,UA: NEGATIVE
Spec Grav, UA: 1.015 (ref 1.010–1.025)
Urobilinogen, UA: 0.2 E.U./dL
pH, UA: 6 (ref 5.0–8.0)

## 2022-12-20 NOTE — Progress Notes (Signed)
ROB: Haley Robles is a 22 y.o. G40P0101 female at [redacted]w[redacted]d.  Doing well, no major issues. Has questions about sleep positions in third trimester.  Size<dates, recommend growth [place]. BP elevated today (diastolic) with repeat normal.  RTC in 2 weeks, for 36 week cultures then. PTL precautions given as patient with prior h/o PTL.Marland Kitchen

## 2022-12-20 NOTE — Progress Notes (Signed)
ROB [redacted]w[redacted]d Patient is well today. She states good fetal movement. She has no new concerns.

## 2022-12-26 ENCOUNTER — Ambulatory Visit
Admission: RE | Admit: 2022-12-26 | Discharge: 2022-12-26 | Disposition: A | Discharge status: Home / Self Care | Payer: Medicaid Other | Source: Ambulatory Visit | Attending: Obstetrics and Gynecology | Admitting: Obstetrics and Gynecology

## 2022-12-26 DIAGNOSIS — O26843 Uterine size-date discrepancy, third trimester: Secondary | ICD-10-CM | POA: Diagnosis present

## 2022-12-31 ENCOUNTER — Telehealth: Payer: Self-pay | Admitting: Obstetrics

## 2022-12-31 NOTE — Telephone Encounter (Signed)
Left message for patient to call office back to r/s appt. MyChart message sent 

## 2023-01-03 ENCOUNTER — Encounter: Payer: Medicaid Other | Admitting: Obstetrics

## 2023-01-03 DIAGNOSIS — O0990 Supervision of high risk pregnancy, unspecified, unspecified trimester: Secondary | ICD-10-CM

## 2023-01-07 ENCOUNTER — Encounter: Payer: Self-pay | Admitting: Advanced Practice Midwife

## 2023-01-07 ENCOUNTER — Ambulatory Visit (INDEPENDENT_AMBULATORY_CARE_PROVIDER_SITE_OTHER): Payer: Medicaid Other | Admitting: Advanced Practice Midwife

## 2023-01-07 VITALS — BP 100/65 | HR 73 | Wt 144.2 lb

## 2023-01-07 DIAGNOSIS — Z8759 Personal history of other complications of pregnancy, childbirth and the puerperium: Secondary | ICD-10-CM

## 2023-01-07 DIAGNOSIS — Z3685 Encounter for antenatal screening for Streptococcus B: Secondary | ICD-10-CM

## 2023-01-07 DIAGNOSIS — Z369 Encounter for antenatal screening, unspecified: Secondary | ICD-10-CM

## 2023-01-07 DIAGNOSIS — Z3483 Encounter for supervision of other normal pregnancy, third trimester: Secondary | ICD-10-CM

## 2023-01-07 DIAGNOSIS — Z3A37 37 weeks gestation of pregnancy: Secondary | ICD-10-CM

## 2023-01-07 DIAGNOSIS — O0990 Supervision of high risk pregnancy, unspecified, unspecified trimester: Secondary | ICD-10-CM

## 2023-01-07 LAB — POCT URINALYSIS DIPSTICK OB
Bilirubin, UA: NEGATIVE
Blood, UA: NEGATIVE
Glucose, UA: NEGATIVE
Ketones, UA: NEGATIVE
Nitrite, UA: NEGATIVE
Spec Grav, UA: 1.025 (ref 1.010–1.025)
Urobilinogen, UA: 0.2 E.U./dL
pH, UA: 6 (ref 5.0–8.0)

## 2023-01-07 NOTE — Telephone Encounter (Signed)
Patient was seen on 1/22 with JEG and patient is prescheduled for future appointments

## 2023-01-07 NOTE — Addendum Note (Signed)
Addended by: Minette Headland on: 01/07/2023 01:56 PM   Modules accepted: Orders

## 2023-01-07 NOTE — Progress Notes (Signed)
Routine Prenatal Care Visit  Subjective  Haley Robles is a 22 y.o. G2P0101 at [redacted]w[redacted]d being seen today for ongoing prenatal care.  She is currently monitored for the following issues for this high-risk pregnancy and has Vomiting; History of UTI; History of marijuana use; Anxiety and depression; History of preterm premature rupture of membranes (PPROM); Supervision of high risk pregnancy, antepartum; PCOS (polycystic ovarian syndrome); and Seasonal allergic rhinitis due to pollen on their problem list.  ----------------------------------------------------------------------------------- Patient reports no complaints.  She feels ready for baby. Contractions: Not present. Vag. Bleeding: None.  Movement: Present. Leaking Fluid denies.  ----------------------------------------------------------------------------------- The following portions of the patient's history were reviewed and updated as appropriate: allergies, current medications, past family history, past medical history, past social history, past surgical history and problem list. Problem list updated.  Objective  Blood pressure 100/65, pulse 73, weight 144 lb 3.2 oz (65.4 kg). Pregravid weight 115 lb (52.2 kg) Total Weight Gain 29 lb 3.2 oz (13.2 kg) Urinalysis: Urine Protein    Urine Glucose    Fetal Status: Fetal Heart Rate (bpm): 143 Fundal Height: 36 cm Movement: Present     General:  Alert, oriented and cooperative. Patient is in no acute distress.  Skin: Skin is warm and dry. No rash noted.   Cardiovascular: Normal heart rate noted  Respiratory: Normal respiratory effort, no problems with respiration noted  Abdomen: Soft, gravid, appropriate for gestational age. Pain/Pressure: Absent     Pelvic:   Self swab GBS         Extremities: Normal range of motion.  Edema: None  Mental Status: Normal mood and affect. Normal behavior. Normal judgment and thought content.   Assessment   22 y.o. G2P0101 at [redacted]w[redacted]d by  01/25/2023, by Ultrasound  presenting for routine prenatal visit  Plan   g2 Problems (from 06/04/22 to present)     Problem Noted Resolved   Supervision of high risk pregnancy, antepartum 06/14/2022 by Cleophas Dunker, CMA No   Overview Signed 11/23/2022  2:59 PM by Luvenia Redden, PA-C     Nursing Staff Provider  Office Location [facility] Dating  01/25/2023, by Ultrasound  [facility] Model  Anatomy US  Normal   Language  English    Flu Vaccine  11/23/22 Genetic/Carrier Screen  NIPS:   Negative  AFP:    Horizon:  TDaP Vaccine   11/23/22 Hgb A1C or  GTT Early  Third trimester  normal   COVID Vaccine    LAB RESULTS   Rhogam  O/Positive/-- (07/10 1349)  Blood Type O/Positive/-- (07/10 1349)   Baby Feeding Plan  Breast/Pump Antibody Negative (07/10 1349)  Contraception  POPs Rubella 1.90 (07/10 1349)  Circumcision   RPR Non Reactive (11/15 0940)   Pediatrician   [place] HBsAg Negative (07/10 1349)   Support Person  BF/FOB HCVAb Non Reactive (07/10 1349)   Prenatal Classes  HIV Non Reactive (11/15 0940)     BTL Consent NA GBS   (For PCN allergy, check sensitivities)   VBAC Consent NA Pap Diagnosis  Date Value Ref Range Status  12/12/2020      - Negative for intraepithelial lesion or malignancy (NILM)         DME Rx [ ]  BP cuff [ ]  Weight Scale Waterbirth  [ ]  Class [ ]  Consent [ ]  CNM visit  PHQ9 & GAD7 [  ] new OB [  ] 28 weeks  [  ] 36 weeks Induction  [ ]  Orders Entered [ ]   Foley Y/N             Term labor symptoms and general obstetric precautions including but not limited to vaginal bleeding, contractions, leaking of fluid and fetal movement were reviewed in detail with the patient. Please refer to After Visit Summary for other counseling recommendations.   Return in about 1 week (around 01/14/2023) for rob.  Rod Can, CNM 01/07/2023 1:27 PM

## 2023-01-09 LAB — STREP GP B NAA: Strep Gp B NAA: NEGATIVE

## 2023-01-10 ENCOUNTER — Encounter: Payer: Medicaid Other | Admitting: Advanced Practice Midwife

## 2023-01-10 ENCOUNTER — Encounter: Payer: Medicaid Other | Admitting: Obstetrics and Gynecology

## 2023-01-15 ENCOUNTER — Encounter: Payer: Self-pay | Admitting: Licensed Practical Nurse

## 2023-01-15 ENCOUNTER — Ambulatory Visit (INDEPENDENT_AMBULATORY_CARE_PROVIDER_SITE_OTHER): Payer: Medicaid Other | Admitting: Licensed Practical Nurse

## 2023-01-15 VITALS — BP 96/52 | HR 74 | Wt 145.9 lb

## 2023-01-15 DIAGNOSIS — Z3483 Encounter for supervision of other normal pregnancy, third trimester: Secondary | ICD-10-CM

## 2023-01-15 DIAGNOSIS — Z3480 Encounter for supervision of other normal pregnancy, unspecified trimester: Secondary | ICD-10-CM

## 2023-01-15 DIAGNOSIS — Z3A38 38 weeks gestation of pregnancy: Secondary | ICD-10-CM

## 2023-01-15 LAB — POCT URINALYSIS DIPSTICK OB
Bilirubin, UA: NEGATIVE
Blood, UA: NEGATIVE
Glucose, UA: NEGATIVE
Ketones, UA: NEGATIVE
Leukocytes, UA: NEGATIVE
Nitrite, UA: NEGATIVE
POC,PROTEIN,UA: NEGATIVE
Spec Grav, UA: 1.02 (ref 1.010–1.025)
Urobilinogen, UA: 0.2 E.U./dL
pH, UA: 6 (ref 5.0–8.0)

## 2023-01-15 NOTE — Progress Notes (Signed)
Routine Prenatal Care Visit  Subjective  Haley Robles is a 22 y.o. G2P0101 at [redacted]w[redacted]d being seen today for ongoing prenatal care.  She is currently monitored for the following issues for this low-risk pregnancy and has Vomiting; History of UTI; History of marijuana use; Anxiety and depression; History of preterm premature rupture of membranes (PPROM); Supervision of high risk pregnancy, antepartum; PCOS (polycystic ovarian syndrome); and Seasonal allergic rhinitis due to pollen on their problem list.  ----------------------------------------------------------------------------------- Patient reports  sleep disturbances d/t frequent BR trips .  Doing well. Wynelle Fanny has been good.  -FH 32 had recent growth US showing Normal AFI and EFI 20%, pt reports feeling fetus in pelvis, [place] tech told her the baby is low. Fetus palpates around 6lbs.  -Her partner gets pain time off, will be around for PP support  -has her carseat and breast pump   -reviewed VE in the antepartum period, pt worried because she has heard they are painful, pt aware she does not have to have cervical checks ever, but they are useful when assessing labor or if she would desire a membrane sweep, pt seems interested in a sweep at 40wks.   Contractions: Irritability. Vag. Bleeding: None.  Movement: Present. Leaking Fluid denies.  ----------------------------------------------------------------------------------- The following portions of the patient's history were reviewed and updated as appropriate: allergies, current medications, past family history, past medical history, past social history, past surgical history and problem list. Problem list updated.  Objective  Blood pressure (!) 96/52, pulse 74, weight 145 lb 14.4 oz (66.2 kg). Pregravid weight 115 lb (52.2 kg) Total Weight Gain 30 lb 14.4 oz (14 kg) Urinalysis: Urine Protein Negative  Urine Glucose Negative  Fetal Status: Fetal Heart Rate (bpm): 140 Fundal Height: 34 cm Movement:  Present     General:  Alert, oriented and cooperative. Patient is in no acute distress.  Skin: Skin is warm and dry. No rash noted.   Cardiovascular: Normal heart rate noted  Respiratory: Normal respiratory effort, no problems with respiration noted  Abdomen: Soft, gravid, appropriate for gestational age. Pain/Pressure: Present     Pelvic:  Cervical exam deferred        Extremities: Normal range of motion.  Edema: None  Mental Status: Normal mood and affect. Normal behavior. Normal judgment and thought content.   Assessment   22 y.o. G2P0101 at [redacted]w[redacted]d by  01/25/2023, by Ultrasound presenting for routine prenatal visit  Plan   g2 Problems (from 06/04/22 to present)     Problem Noted Resolved   Supervision of high risk pregnancy, antepartum 06/14/2022 by Cleophas Dunker, CMA No   Overview Signed 11/23/2022  2:59 PM by Luvenia Redden, PA-C     Nursing Staff Provider  Office Location [facility] Dating  01/25/2023, by Ultrasound  [facility] Model  Anatomy US  Normal   Language  English    Flu Vaccine  11/23/22 Genetic/Carrier Screen  NIPS:   Negative  AFP:    [place]:  TDaP Vaccine   11/23/22 Hgb A1C or  GTT Early  Third trimester  normal   COVID Vaccine    LAB RESULTS   Rhogam  O/Positive/-- (07/10 1349)  Blood Type O/Positive/-- (07/10 1349)   Baby Feeding Plan  Breast/Pump Antibody Negative (07/10 1349)  Contraception  POPs Rubella 1.90 (07/10 1349)  Circumcision   RPR Non Reactive (11/15 0940)   Pediatrician   [place] HBsAg Negative (07/10 1349)   Support Person  BF/FOB HCVAb Non Reactive (07/10 1349)   Prenatal  Classes  HIV Non Reactive (11/15 0940)     BTL Consent NA GBS   (For PCN allergy, check sensitivities)   VBAC Consent NA Pap Diagnosis  Date Value Ref Range Status  12/12/2020      - Negative for intraepithelial lesion or malignancy (NILM)         DME Rx [ ]  BP cuff [ ]  Weight Scale Waterbirth  [ ]  Class [ ]  Consent [ ]  CNM visit  PHQ9 & GAD7 [  ] new OB [  ] 28 weeks   [  ] 36 weeks Induction  [ ]  Orders Entered [ ] Foley Y/N             Term labor symptoms and general obstetric precautions including but not limited to vaginal bleeding, contractions, leaking of fluid and fetal movement were reviewed in detail with the patient. Please refer to After Visit Summary for other counseling recommendations.   Return in about 1 week (around 01/22/2023) for [place].  Roberto Scales, [facility]  01/15/23  4:53 PM

## 2023-01-16 ENCOUNTER — Encounter: Payer: Self-pay | Admitting: Advanced Practice Midwife

## 2023-01-16 ENCOUNTER — Encounter: Payer: Medicaid Other | Admitting: Obstetrics and Gynecology

## 2023-01-16 ENCOUNTER — Other Ambulatory Visit: Payer: Self-pay

## 2023-01-16 ENCOUNTER — Inpatient Hospital Stay
Admission: EM | Admit: 2023-01-16 | Discharge: 2023-01-17 | DRG: 807 | Disposition: A | Discharge status: Home / Self Care | Payer: Medicaid Other | Attending: Certified Nurse Midwife | Admitting: Certified Nurse Midwife

## 2023-01-16 DIAGNOSIS — F1290 Cannabis use, unspecified, uncomplicated: Secondary | ICD-10-CM | POA: Diagnosis not present

## 2023-01-16 DIAGNOSIS — F418 Other specified anxiety disorders: Secondary | ICD-10-CM | POA: Diagnosis not present

## 2023-01-16 DIAGNOSIS — Z3A38 38 weeks gestation of pregnancy: Secondary | ICD-10-CM | POA: Diagnosis not present

## 2023-01-16 DIAGNOSIS — O26893 Other specified pregnancy related conditions, third trimester: Secondary | ICD-10-CM | POA: Diagnosis present

## 2023-01-16 DIAGNOSIS — O09213 Supervision of pregnancy with history of pre-term labor, third trimester: Secondary | ICD-10-CM

## 2023-01-16 DIAGNOSIS — O99324 Drug use complicating childbirth: Secondary | ICD-10-CM

## 2023-01-16 DIAGNOSIS — O99344 Other mental disorders complicating childbirth: Secondary | ICD-10-CM

## 2023-01-16 DIAGNOSIS — Z3A39 39 weeks gestation of pregnancy: Secondary | ICD-10-CM | POA: Diagnosis not present

## 2023-01-16 LAB — URINE DRUG SCREEN, QUALITATIVE (ARMC ONLY)
Amphetamines, Ur Screen: NOT DETECTED
Barbiturates, Ur Screen: NOT DETECTED
Benzodiazepine, Ur Scrn: NOT DETECTED
Cannabinoid 50 Ng, Ur ~~LOC~~: NOT DETECTED
Cocaine Metabolite,Ur ~~LOC~~: NOT DETECTED
MDMA (Ecstasy)Ur Screen: NOT DETECTED
Methadone Scn, Ur: NOT DETECTED
Opiate, Ur Screen: NOT DETECTED
Phencyclidine (PCP) Ur S: NOT DETECTED
Tricyclic, Ur Screen: NOT DETECTED

## 2023-01-16 LAB — CBC
HCT: 32.7 % — ABNORMAL LOW (ref 36.0–46.0)
HCT: 28.3 % — ABNORMAL LOW (ref 36.0–46.0)
Hemoglobin: 11 g/dL — ABNORMAL LOW (ref 12.0–15.0)
Hemoglobin: 9.5 g/dL — ABNORMAL LOW (ref 12.0–15.0)
MCH: 30.1 pg (ref 26.0–34.0)
MCH: 30.4 pg (ref 26.0–34.0)
MCHC: 33.6 g/dL (ref 30.0–36.0)
MCHC: 33.6 g/dL (ref 30.0–36.0)
MCV: 89.3 fL (ref 80.0–100.0)
MCV: 90.4 fL (ref 80.0–100.0)
Platelets: 183 10*3/uL (ref 150–400)
Platelets: 177 10*3/uL (ref 150–400)
RBC: 3.66 MIL/uL — ABNORMAL LOW (ref 3.87–5.11)
RBC: 3.13 MIL/uL — ABNORMAL LOW (ref 3.87–5.11)
RDW: 12.6 % (ref 11.5–15.5)
RDW: 12.5 % (ref 11.5–15.5)
WBC: 14.8 10*3/uL — ABNORMAL HIGH (ref 4.0–10.5)
WBC: 15.8 10*3/uL — ABNORMAL HIGH (ref 4.0–10.5)
nRBC: 0 % (ref 0.0–0.2)
nRBC: 0 % (ref 0.0–0.2)

## 2023-01-16 LAB — TYPE AND SCREEN
ABO/RH(D): O POS
Antibody Screen: NEGATIVE

## 2023-01-16 LAB — RPR: RPR Ser Ql: NONREACTIVE

## 2023-01-16 MED ORDER — DIBUCAINE (PERIANAL) 1 % EX OINT: 1.0000 | TOPICAL_OINTMENT | CUTANEOUS | Status: DC | PRN

## 2023-01-16 MED ORDER — EPHEDRINE 5 MG/ML INJ
10.0000 mg | INTRAVENOUS | Status: DC | PRN
  Filled 2023-01-16: qty 5

## 2023-01-16 MED ORDER — TETANUS-DIPHTH-ACELL PERTUSSIS 5-2.5-18.5 LF-MCG/0.5 IM SUSY: 0.5000 mL | PREFILLED_SYRINGE | Freq: Once | INTRAMUSCULAR | Status: DC

## 2023-01-16 MED ORDER — ONDANSETRON HCL 4 MG PO TABS: 4.0000 mg | ORAL_TABLET | ORAL | Status: DC | PRN

## 2023-01-16 MED ORDER — ONDANSETRON HCL 4 MG/2ML IJ SOLN: 4.0000 mg | INTRAMUSCULAR | Status: DC | PRN

## 2023-01-16 MED ORDER — ACETAMINOPHEN 325 MG PO TABS: 650.0000 mg | ORAL_TABLET | ORAL | Status: DC | PRN

## 2023-01-16 MED ORDER — WITCH HAZEL-GLYCERIN EX PADS: 1.0000 | MEDICATED_PAD | CUTANEOUS | Status: DC | PRN

## 2023-01-16 MED ORDER — LACTATED RINGERS IV SOLN: INTRAVENOUS | Status: DC

## 2023-01-16 MED ORDER — DIPHENHYDRAMINE HCL 25 MG PO CAPS: 25.0000 mg | ORAL_CAPSULE | Freq: Four times a day (QID) | ORAL | Status: DC | PRN

## 2023-01-16 MED ORDER — PHENYLEPHRINE 80 MCG/ML (10ML) SYRINGE FOR IV PUSH (FOR BLOOD PRESSURE SUPPORT): 80.0000 ug | PREFILLED_SYRINGE | INTRAVENOUS | Status: DC | PRN

## 2023-01-16 MED ORDER — FENTANYL-BUPIVACAINE-NACL 0.5-0.125-0.9 MG/250ML-% EP SOLN
12.0000 mL/h | EPIDURAL | Status: DC | PRN
  Filled 2023-01-16: qty 250

## 2023-01-16 MED ORDER — OXYTOCIN-SODIUM CHLORIDE 30-0.9 UT/500ML-% IV SOLN: 2.5000 [IU]/h | INTRAVENOUS | Status: DC

## 2023-01-16 MED ORDER — LACTATED RINGERS IV SOLN: 500.0000 mL | INTRAVENOUS | Status: DC | PRN

## 2023-01-16 MED ORDER — SIMETHICONE 80 MG PO CHEW: 80.0000 mg | CHEWABLE_TABLET | ORAL | Status: DC | PRN

## 2023-01-16 MED ORDER — OXYTOCIN BOLUS FROM INFUSION: 333.0000 mL | Freq: Once | INTRAVENOUS | Status: DC

## 2023-01-16 MED ORDER — SENNOSIDES-DOCUSATE SODIUM 8.6-50 MG PO TABS
2.0000 | ORAL_TABLET | Freq: Every day | ORAL | Status: DC
  Administered 2023-01-17: 2 via ORAL
  Filled 2023-01-16: qty 2

## 2023-01-16 MED ORDER — LIDOCAINE HCL (PF) 1 % IJ SOLN: 30.0000 mL | INTRAMUSCULAR | Status: DC | PRN

## 2023-01-16 MED ORDER — LACTATED RINGERS IV SOLN: 500.0000 mL | Freq: Once | INTRAVENOUS | Status: DC

## 2023-01-16 MED ORDER — PRENATAL MULTIVITAMIN CH
1.0000 | ORAL_TABLET | Freq: Every day | ORAL | Status: DC
  Administered 2023-01-16 – 2023-01-17 (×2): 1 via ORAL
  Filled 2023-01-16 (×2): qty 1

## 2023-01-16 MED ORDER — IBUPROFEN 600 MG PO TABS
600.0000 mg | ORAL_TABLET | Freq: Four times a day (QID) | ORAL | Status: DC
  Administered 2023-01-16 (×4): 600 mg via ORAL
  Filled 2023-01-16 (×6): qty 1

## 2023-01-16 MED ORDER — ACETAMINOPHEN 325 MG PO TABS
650.0000 mg | ORAL_TABLET | ORAL | Status: DC | PRN
  Administered 2023-01-16 (×2): 650 mg via ORAL
  Filled 2023-01-16 (×2): qty 2

## 2023-01-16 MED ORDER — ONDANSETRON HCL 4 MG/2ML IJ SOLN: 4.0000 mg | Freq: Four times a day (QID) | INTRAMUSCULAR | Status: DC | PRN

## 2023-01-16 MED ORDER — DIPHENHYDRAMINE HCL 50 MG/ML IJ SOLN: 12.5000 mg | INTRAMUSCULAR | Status: DC | PRN

## 2023-01-16 MED ORDER — EPHEDRINE 5 MG/ML INJ: 10.0000 mg | INTRAVENOUS | Status: DC | PRN

## 2023-01-16 MED ORDER — COCONUT OIL OIL
1.0000 | TOPICAL_OIL | Status: DC | PRN
  Administered 2023-01-16: 1 via TOPICAL
  Filled 2023-01-16: qty 7.5

## 2023-01-16 MED ORDER — BENZOCAINE-MENTHOL 20-0.5 % EX AERO: 1.0000 | INHALATION_SPRAY | CUTANEOUS | Status: DC | PRN

## 2023-01-16 NOTE — H&P (Addendum)
OB History & Physical   History of Present Illness:  Chief Complaint: contractions  HPI:  Haley Robles is a 23 y.o. G19P0101 female at [redacted]w[redacted]d dated by 7 week ultrasound.  Her pregnancy has been complicated by history of PPROM, preterm delivery, anxiety, depression, marijuana use .    She reports contractions that started just before midnight.   She denies leakage of fluid.   She reports small vaginal bleeding.   She reports fetal movement.    Total weight gain for pregnancy: 14 kg   Obstetrical Problem List: g2 Problems (from 06/04/22 to present)     Problem Noted Resolved   Supervision of high risk pregnancy, antepartum 06/14/2022 by Cleophas Dunker, CMA No   Overview Signed 11/23/2022  2:59 PM by Luvenia Redden, PA-C     Nursing Staff Provider  Office Location [facility] Dating  01/25/2023, by Ultrasound  [facility] Model  Anatomy US  Normal   Language  English    Flu Vaccine  11/23/22 Genetic/Carrier Screen  NIPS:   Negative  AFP:    [place]:  TDaP Vaccine   11/23/22 Hgb A1C or  GTT Early  Third trimester  normal   COVID Vaccine    LAB RESULTS   Rhogam  O/Positive/-- (07/10 1349)  Blood Type O/Positive/-- (07/10 1349)   Baby Feeding Plan  Breast/Pump Antibody Negative (07/10 1349)  Contraception  POPs Rubella 1.90 (07/10 1349)  Circumcision   RPR Non Reactive (11/15 0940)   Pediatrician   [place] HBsAg Negative (07/10 1349)   Support Person  BF/FOB HCVAb Non Reactive (07/10 1349)   Prenatal Classes  HIV Non Reactive (11/15 0940)     BTL Consent NA GBS   (For PCN allergy, check sensitivities)   VBAC Consent NA Pap Diagnosis  Date Value Ref Range Status  12/12/2020      - Negative for intraepithelial lesion or malignancy (NILM)         DME Rx [ ]  BP cuff [ ]  Weight Scale Waterbirth  [ ]  Class [ ]  Consent [ ]  CNM visit  PHQ9 & GAD7 [  ] new OB [  ] 28 weeks  [  ] 36 weeks Induction  [ ]  Orders Entered [ ] Foley Y/N             Maternal Medical History:   Past Medical  History:  Diagnosis Date   Abdominal pain, recurrent    Anxiety    Diarrhea    Vomiting     Past Surgical History:  Procedure Laterality Date   NO PAST SURGERIES      No Known Allergies  Prior to Admission medications   Medication Sig Start Date End Date Taking? Authorizing Provider  Prenatal MV & Min w/FA-DHA (PRENATAL ADULT GUMMY/DHA/FA) 0.4-25 MG CHEW Chew 1 tablet by mouth daily. 07/12/22   Philip Aspen, CNM    OB History  Haley Robles Term Preterm AB Living  2 1   1   1   SAB IAB Ectopic Multiple Live Births        0 1    # Outcome Date GA Lbr Len/2nd Weight Sex Delivery Anes PTL Lv  2 Current           1 Preterm 05/12/20 [redacted]w[redacted]d 02:30 / 00:09 2790 g M Vag-Spont None  LIV    Prenatal care site: [facility]  Social History: She  reports that she has never smoked. She has never used smokeless tobacco. She reports that she  does not currently use alcohol. She reports current drug use. Drug: Marijuana.  Family History: family history includes Aneurysm in her paternal grandmother; Cancer in her maternal grandfather and paternal grandfather; Cancer (age of onset: 79) in her father; Cholelithiasis in her father and maternal grandfather; Diabetes in her maternal grandfather and mother; Pancreatitis in her father; Ulcers in her father and maternal grandmother.    Review of Systems:  Review of Systems  Constitutional:  Negative for chills and fever.  HENT:  Negative for congestion, ear discharge, ear pain, hearing loss, sinus pain and sore throat.   Eyes:  Negative for blurred vision and double vision.  Respiratory:  Negative for cough, shortness of breath and wheezing.   Cardiovascular:  Negative for chest pain, palpitations and leg swelling.  Gastrointestinal:  Positive for abdominal pain. Negative for blood in stool, constipation, diarrhea, heartburn, melena, nausea and vomiting.  Genitourinary:  Negative for dysuria, flank pain, frequency, hematuria and urgency.   Musculoskeletal:  Negative for back pain, joint pain and myalgias.  Skin:  Negative for itching and rash.  Neurological:  Negative for dizziness, tingling, tremors, sensory change, speech change, focal weakness, seizures, loss of consciousness, weakness and headaches.  Endo/Heme/Allergies:  Negative for environmental allergies. Does not bruise/bleed easily.  Psychiatric/Behavioral:  Negative for depression, hallucinations, memory loss, substance abuse and suicidal ideas. The patient is not nervous/anxious and does not have insomnia.      Physical Exam:  BP 112/72   Pulse 96   Temp 98.2 F (36.8 C) (Oral)   Resp 16   Ht 5\' 4"  (1.626 m)   Wt 66.2 kg   LMP  (LMP Unknown)   BMI 25.06 kg/m   Constitutional: Well nourished, well developed female in no acute distress.  HEENT: normal Skin: Warm and dry.  Cardiovascular: Regular rate and rhythm.   Extremity:  no edema   Respiratory: Clear to auscultation bilateral. Normal respiratory effort Abdomen: FHT present Back: no CVAT Psych: Alert and Oriented x3. No memory deficits. Normal mood and affect.    Pelvic exam: per RN Angela Adam 6.5/100/-1   Baseline FHR: 130 beats/min   Variability: moderate   Accelerations: present   Decelerations: absent Contractions: present frequency: every 2-4 minutes Overall assessment: reassuring   Lab Results  Component Value Date   SARSCOV2NAA NEGATIVE 01/17/2022    Assessment:  Haley Robles is a 22 y.o. G110P0101 female at [redacted]w[redacted]d with active labor.   Plan:  Admit to Labor & Delivery  CBC, T&S, Clrs, IVF GBS negative.   Fetal well-being: Category I Anticipate vaginal delivery    Rod Can, CNM 01/16/2023 3:22 AM

## 2023-01-16 NOTE — Lactation Note (Signed)
This note was copied from a baby's chart. Lactation Consultation Note  Patient Name: Haley Robles KPTWS'F Date: 01/16/2023 Reason for consult: 1st time breastfeeding;Infant < 6lbs;Early term 37-38.6wks Age:24 hours  Maternal Data This is mom's 2nd baby, SVD. Mom exclusively pumped for her first baby who was LPT as baby did not latch. Mom's plan is to breastfeed this baby. Mom with a history of PCOS, anxiety, depression, and marijuana use. Has patient been taught Hand Expression?: Yes Does the patient have breastfeeding experience prior to this delivery?: Yes How long did the patient breastfeed?: Mom exclusively pumped for 3-4 months as LPT baby would not latch.  Feeding Mother's Current Feeding Choice: Breast Milk Mom reports baby has been latching and breastfeeding. Mom has [place] phone number on white board to call [facility] for next feed.  Interventions Interventions: Breast feeding basics reviewed;Hand express;Education. Reviewed what to expect in first days when breastfeeding including cluster feeding, observing for baby's feeding cues, how to know the baby is getting enough, and number of feeds (8-12) in 24 hours.  Discharge Discharge Education: Warning signs for feeding baby;Outpatient recommendation Pump: Personal  Consult Status Consult Status: Follow-up Date: 01/16/23 Follow-up type: In-patient  Update provided to care nurse.  Jonna Alexiya Franqui 01/16/2023, 11:06 AM

## 2023-01-16 NOTE — Discharge Summary (Signed)
OB Discharge Summary     Patient Name: Haley Robles DOB: 2000/12/27 MRN: [id number]  Date of admission: 01/16/2023 Delivering provider: Rod Can, CNM  Date of Delivery: 01/16/2023  Date of discharge: 01/17/2023  Admitting diagnosis: Labor and delivery, indication for care [O75.9] Intrauterine pregnancy: [redacted]w[redacted]d     Secondary diagnosis: None     Discharge diagnosis: Term Pregnancy Delivered                                                                                                Post partum procedures: None  Augmentation: N/A  Complications: None  Hospital course:  Onset of Labor With Vaginal Delivery      22 y.o. yo G2P0101 at [redacted]w[redacted]d was admitted in Active Labor on 01/16/2023. Labor course was complicated by NA  Membrane Rupture Time/Date: 3:37 AM ,01/16/2023   Delivery Method:Vaginal, Spontaneous  Episiotomy: None  Lacerations:  None  See delivery note for details  Patient had an uncomplicated postpartum course.  She is tolerating a regular diet, her pain is controlled with PO medication, she is ambulating and voiding without difficulty.   Patient is discharged home in stable condition on 01/17/23.  Newborn Data: Birth date:01/16/2023  Birth time:3:43 AM  Gender:Female  Living status:Living  Apgars:8 ,9  Weight:2690 g  5 pounds 15 ounces   Physical exam  Vitals:   01/16/23 0428 01/16/23 0443 01/16/23 0500 01/16/23 0615  BP: 107/63 108/70 104/62 102/69  Pulse: 74 71 76 98  Resp:    18  Temp:    98.4 F (36.9 C)  TempSrc:    Oral  SpO2:    100%  Weight:      Height:       General: alert, cooperative, and no distress Heart: RRR Lungs: CTAB Abdomen: soft, non-tender, normal bowel sounds Lochia: appropriate Uterine Fundus: firm Incision: N/A DVT Evaluation: No evidence of DVT seen on physical exam.  Labs: Lab Results  Component Value Date   WBC 14.8 (H) 01/16/2023   HGB 11.0 (L) 01/16/2023   HCT 32.7 (L) 01/16/2023   MCV 89.3 01/16/2023   PLT 183  01/16/2023    Discharge instruction: per After Visit Summary.  Medications:  Allergies as of 01/17/2023   No Known Allergies      Medication List     TAKE these medications    ferrous sulfate 325 (65 FE) MG tablet Take 1 tablet (325 mg total) by mouth daily.   Prenatal Adult Gummy/DHA/FA 0.4-25 MG Chew Chew 1 tablet by mouth daily.           Diet: routine diet  Activity: Advance as tolerated. Pelvic rest for 6 weeks.   Outpatient follow up:  Follow-up Information     Rod Can, CNM. Schedule an appointment as soon as possible for a visit.   Specialty: Obstetrics Why: 2 week telephone and 6 week in office postpartum visits Contact information: [place] [address] [phone]                   Postpartum contraception: Progesterone only pills Rhogam Given postpartum: Rh positive  Rubella vaccine given postpartum: immune Varicella vaccine given postpartum: immune TDaP given antepartum or postpartum: given antepartum    Newborn Delivery   Birth date/time: 01/16/2023 03:43:00 Delivery type: Vaginal, Spontaneous       Baby Feeding: Breast  Disposition:home with mother  SIGNED:  Lurlean Horns, CNM 01/17/23 10:12 AM

## 2023-01-17 MED ORDER — FERROUS SULFATE 325 (65 FE) MG PO TABS: 325.0000 mg | ORAL_TABLET | Freq: Every day | ORAL | 3 refills | Status: DC

## 2023-01-17 NOTE — Progress Notes (Signed)
Patient discharged. Discharge instructions given. Patient verbalizes understanding. Transported by axillary. 

## 2023-01-17 NOTE — TOC Initial Note (Signed)
Transition of Care [facility]) - Initial/Assessment Note    Patient Details  Name: Haley Robles MRN: [id number] Date of Birth: 2001/06/06  [facility] CM/SW Contact:    Shelbie Hutching, RN Phone Number: 01/17/2023, 11:00 AM  Clinical Narrative:                 Called patient's room, she is currently sleeping.  RNCM will try back in about an hour. TOC consult for marijuana use during pregnancy.  Last positive drug screen was in July 2023.  Mom and baby both negative UDS for marijuana now.          Patient Goals and CMS Choice            Expected Discharge Plan and Services         Expected Discharge Date: 01/17/23                                    Prior Living Arrangements/Services                       Activities of Daily Living Home Assistive Devices/Equipment: None ADL Screening (condition at time of admission) Patient's cognitive ability adequate to safely complete daily activities?: Yes Is the patient deaf or have difficulty hearing?: No Does the patient have difficulty seeing, even when wearing glasses/contacts?: No Does the patient have difficulty concentrating, remembering, or making decisions?: No Patient able to express need for assistance with ADLs?: No Does the patient have difficulty dressing or bathing?: No Independently performs ADLs?: Yes (appropriate for developmental age) Does the patient have difficulty walking or climbing stairs?: No Weakness of Legs: None Weakness of Arms/Hands: None  Permission Sought/Granted                  Emotional Assessment              Admission diagnosis:  Labor and delivery, indication for care [O75.9] Patient Active Problem List   Diagnosis Date Noted   Labor and delivery, indication for care 01/16/2023   Postpartum care following vaginal delivery 01/16/2023   Encounter for care or examination of lactating mother 01/16/2023   Supervision of high risk pregnancy, antepartum  06/14/2022   History of preterm premature rupture of membranes (PPROM) 05/12/2020   History of marijuana use 04/04/2020   Anxiety and depression 04/04/2020   PCOS (polycystic ovarian syndrome) 01/29/2018   Seasonal allergic rhinitis due to pollen 05/07/2017   PCP:  Pcp, No Pharmacy:   [facility] [address] [place] [facility] [place] [address] Phone: [phone] Fax: [phone]     Social Determinants of Health (SDOH) Social History: SDOH Screenings   Food Insecurity: Food Insecurity Present (06/14/2022)  Housing: Medium Risk (06/14/2022)  Transportation Needs: No Transportation Needs (06/14/2022)  Depression (PHQ2-9): Low Risk  (06/23/2020)  Financial Resource Strain: Medium Risk (06/14/2022)  Physical Activity: Insufficiently Active (06/14/2022)  Social Connections: Unknown (06/14/2022)  Stress: Stress Concern Present (06/14/2022)  Tobacco Use: Low Risk  (01/16/2023)   SDOH Interventions:     Readmission Risk Interventions     No data to display

## 2023-01-17 NOTE — Final Progress Note (Signed)
Post Partum Day 1 Subjective: Haley Robles is feeling well overall. She is ambulating, voiding, and tolerating POs without difficulty. Her pain is well-controlled and her bleeding is WNL. Her mood is stable. Breastfeeding is going well. She would like to go home today.  Objective: Blood pressure (!) 92/51, pulse 62, temperature 98 F (36.7 C), temperature source Oral, resp. rate 20, height 5\' 4"  (1.626 m), weight 66.2 kg, SpO2 98 %, unknown if currently breastfeeding.  Physical Exam:  General: alert, cooperative, and appears stated age Heart: RRR Lungs: CTAB Abdomen: soft, non-tender, normal bowel sounds Lochia: appropriate Uterine Fundus: firm Incision: N/a DVT Evaluation: No evidence of DVT seen on physical exam.  Recent Labs    01/16/23 0249 01/16/23 1541  HGB 11.0* 9.5*  HCT 32.7* 28.3*    Assessment/Plan: Discharge home PO iron Plans POPs D/c teaching reviewed Video visit in 2 weeks Office visit in 6 weeks   LOS: 1 day   Lurlean Horns, CNM 01/17/2023, 10:13 AM

## 2023-01-17 NOTE — TOC Initial Note (Signed)
Transition of Care [facility]) - Initial/Assessment Note    Patient Details  Name: Haley Robles MRN: [id number] Date of Birth: 04-30-2001  Transition of [facility]) CM/SW Contact:    Shelbie Hutching, RN Phone Number: 01/17/2023, 4:13 PM  Clinical Narrative:                 RNCM was able to speak with patient via phone.  Patient reports that she and the baby are doing well.  They will be discharging home today.  Patient has smoked marijuana in the past, last detected on drug screen in July.  Patient reports that she no longer smokes.  She reports that maybe in the future she may partake socially but it will not be on a regular basis or in the presence of her children. Patient lives with her significant other, he home is prepared.  She has chosen [facility] in [place] for Pediatrics.    No needs.  Patient happy about being discharged today.          Patient Goals and CMS Choice            Expected Discharge Plan and Services         Expected Discharge Date: 01/17/23                                    Prior Living Arrangements/Services                       Activities of Daily Living Home Assistive Devices/Equipment: None ADL Screening (condition at time of admission) Patient's cognitive ability adequate to safely complete daily activities?: Yes Is the patient deaf or have difficulty hearing?: No Does the patient have difficulty seeing, even when wearing glasses/contacts?: No Does the patient have difficulty concentrating, remembering, or making decisions?: No Patient able to express need for assistance with ADLs?: No Does the patient have difficulty dressing or bathing?: No Independently performs ADLs?: Yes (appropriate for developmental age) Does the patient have difficulty walking or climbing stairs?: No Weakness of Legs: None Weakness of Arms/Hands: None  Permission Sought/Granted                  Emotional Assessment               Admission diagnosis:  Labor and delivery, indication for care [O75.9] Patient Active Problem List   Diagnosis Date Noted   Labor and delivery, indication for care 01/16/2023   Postpartum care following vaginal delivery 01/16/2023   Encounter for care or examination of lactating mother 01/16/2023   Supervision of high risk pregnancy, antepartum 06/14/2022   History of preterm premature rupture of membranes (PPROM) 05/12/2020   History of marijuana use 04/04/2020   Anxiety and depression 04/04/2020   PCOS (polycystic ovarian syndrome) 01/29/2018   Seasonal allergic rhinitis due to pollen 05/07/2017   PCP:  Pcp, No Pharmacy:   [facility] [address], [place] - [place] [address] Phone: [phone] Fax: [phone]     Social Determinants of Health (SDOH) Social History: SDOH Screenings   Food Insecurity: Food Insecurity Present (06/14/2022)  Housing: Medium Risk (06/14/2022)  Transportation Needs: No Transportation Needs (06/14/2022)  Depression (PHQ2-9): Low Risk  (06/23/2020)  Financial Resource Strain: Medium Risk (06/14/2022)  Physical Activity: Insufficiently Active (06/14/2022)  Social Connections: Unknown (06/14/2022)  Stress: Stress Concern Present (06/14/2022)  Tobacco Use: Low Risk  (01/16/2023)   SDOH Interventions:     Readmission Risk Interventions     No data to display

## 2023-01-23 ENCOUNTER — Encounter: Payer: Medicaid Other | Admitting: Obstetrics and Gynecology

## 2023-01-31 ENCOUNTER — Telehealth: Payer: Medicaid Other | Admitting: Advanced Practice Midwife

## 2023-02-04 ENCOUNTER — Telehealth: Payer: Medicaid Other | Admitting: Advanced Practice Midwife

## 2023-02-11 ENCOUNTER — Ambulatory Visit (INDEPENDENT_AMBULATORY_CARE_PROVIDER_SITE_OTHER): Payer: Medicaid Other | Admitting: Licensed Practical Nurse

## 2023-02-11 DIAGNOSIS — Z1332 Encounter for screening for maternal depression: Secondary | ICD-10-CM

## 2023-02-11 DIAGNOSIS — Z30011 Encounter for initial prescription of contraceptive pills: Secondary | ICD-10-CM

## 2023-02-11 MED ORDER — NORETHINDRONE 0.35 MG PO TABS: 1.0000 | ORAL_TABLET | Freq: Every day | ORAL | 11 refills | Status: DC

## 2023-02-11 NOTE — Progress Notes (Signed)
Virtual Visit via Telephone Note  I connected with Haley Robles on 02/11/23 at 11:15 AM EST by telephone and verified that I am speaking with the correct person using two identifiers.  Location: Patient: Home in [place], [place] Provider: office in [place], [place]    I discussed the limitations, risks, security and privacy concerns of performing an evaluation and management service by telephone and the availability of in person appointments. I also discussed with the patient that there may be a patient responsible charge related to this service. The patient expressed understanding and agreed to proceed.   History of Present Illness: SVB 01/16/23 with Opal Sidles, arrived 6cm quick to birth,  intact perineum  Sleep: not much 2-3 hour stretches, 1 hour nap Partner back at work, her mother is around to help out Bleeding: light No concerns with voiding stooling  or her perineum  Appetite: good eating decent amount  Breastfeeding: going well, good latch, supplement with formula if taking a nap or at night, pumps 3-4 oz  Mood: doing better denies concerns for depression  Work: [employer] 2 year doing well  Wait a while for a future pregnancy , desires POP    Observations/Objective:  Pt sounds tired but not in distress, speaking in clear coherent sentences.  EPDS 5   Assessment and Plan: PP visit at 2 weeks  Screening for maternal depression   Follow Up Instructions:  -May increase physical activity -Script for Micronor sent to pharmacy, may start in 2 weeks -pelvic rest x 5-6 weeks -reviewed signs of mastitis and when to call -RTC in 4 wks for PP visit     I discussed the assessment and treatment plan with the patient. The patient was provided an opportunity to ask questions and all were answered. The patient agreed with the plan and demonstrated an understanding of the instructions.   The patient was advised to call back or seek an in-person evaluation if the symptoms worsen or if the  condition fails to improve as anticipated.  I provided 15 minutes of non-face-to-face time during this encounter.   Jillene Bucks Yusuke Beza, CNM

## 2023-02-28 ENCOUNTER — Ambulatory Visit (INDEPENDENT_AMBULATORY_CARE_PROVIDER_SITE_OTHER): Payer: Medicaid Other | Admitting: Advanced Practice Midwife

## 2023-02-28 ENCOUNTER — Encounter: Payer: Self-pay | Admitting: Advanced Practice Midwife

## 2023-02-28 NOTE — Progress Notes (Signed)
[facility]  Postpartum Visit  Chief Complaint:  Chief Complaint  Patient presents with   Postpartum Care    History of Present Illness: Patient is a 22 y.o. [id number] presents for postpartum visit.  Review the Delivery Report for details.   Date of delivery: 1/31 2024 Type of delivery: Vaginal delivery - Vacuum or forceps assisted  no Episiotomy No.  Laceration: no  Pregnancy or labor problems:  no Any problems since the delivery:  no  Newborn Details:  SINGLETON :  1. BabyGender female. Dalia, Birth weight: 5 pounds 15 ounces Maternal Details:  Breast or formula feeding: breastfeeding Intercourse: No  Contraception after delivery:  POPs Any bowel or bladder issues: No  Post partum depression/anxiety noted:  no Edinburgh Post-Partum Depression Score: 6 Date of last PAP: December 2021  no abnormalities   Review of Systems: Review of Systems  Constitutional:  Negative for chills and fever.  HENT:  Negative for congestion, ear discharge, ear pain, hearing loss, sinus pain and sore throat.   Eyes:  Negative for blurred vision and double vision.  Respiratory:  Negative for cough, shortness of breath and wheezing.   Cardiovascular:  Negative for chest pain, palpitations and leg swelling.  Gastrointestinal:  Negative for abdominal pain, blood in stool, constipation, diarrhea, heartburn, melena, nausea and vomiting.  Genitourinary:  Negative for dysuria, flank pain, frequency, hematuria and urgency.  Musculoskeletal:  Negative for back pain, joint pain and myalgias.  Skin:  Negative for itching and rash.  Neurological:  Negative for dizziness, tingling, tremors, sensory change, speech change, focal weakness, seizures, loss of consciousness, weakness and headaches.  Endo/Heme/Allergies:  Negative for environmental allergies. Does not bruise/bleed easily.  Psychiatric/Behavioral:  Negative for depression, hallucinations, memory loss, substance abuse and suicidal ideas. The  patient is not nervous/anxious and does not have insomnia.     Past Medical History:  Past Medical History:  Diagnosis Date   Abdominal pain, recurrent    Anxiety    Diarrhea    Vomiting     Past Surgical History:  Past Surgical History:  Procedure Laterality Date   NO PAST SURGERIES      Family History:  Family History  Problem Relation Age of Onset   Diabetes Mother        prediabetic   Cancer Father 60       testicular   Pancreatitis Father    Cholelithiasis Father    Ulcers Father    Ulcers Maternal Grandmother    Cancer Maternal Grandfather        mouth   Diabetes Maternal Grandfather    Cholelithiasis Maternal Grandfather    Aneurysm Paternal Grandmother        brain   Cancer Paternal Grandfather        lung    Social History:  Social History   Socioeconomic History   Marital status: Significant Other    Spouse name: Quita Skye   Number of children: 1   Years of education: 12   Highest education level: Not on file  Occupational History   Occupation: stay at home mom  Tobacco Use   Smoking status: Never   Smokeless tobacco: Never  Vaping Use   Vaping Use: Never used  Substance and Sexual Activity   Alcohol use: Not Currently    Comment: occasionally   Drug use: Yes    Types: Marijuana    Comment: at start of pregnancy   Sexual activity: Yes    Partners: Male  Birth control/protection: Injection, None  Other Topics Concern   Not on file  Social History Narrative   6th grade   Social Determinants of Health   Financial Resource Strain: Medium Risk (06/14/2022)   Overall Financial Resource Strain (CARDIA)    Difficulty of Paying Living Expenses: Somewhat hard  Food Insecurity: Food Insecurity Present (06/14/2022)   Hunger Vital Sign    Worried About Running Out of Food in the Last Year: Sometimes true    Ran Out of Food in the Last Year: Sometimes true  Transportation Needs: No Transportation Needs (06/14/2022)   PRAPARE - Armed forces logistics/support/administrative officer (Medical): No    Lack of Transportation (Non-Medical): No  Physical Activity: Insufficiently Active (06/14/2022)   Exercise Vital Sign    Days of Exercise per Week: 2 days    Minutes of Exercise per Session: 20 min  Stress: Stress Concern Present (06/14/2022)   [place]    Feeling of Stress : To some extent  Social Connections: Unknown (06/14/2022)   Social Connection and Isolation Panel [NHANES]    Frequency of Communication with Friends and Family: More than three times a week    Frequency of Social Gatherings with Friends and Family: Three times a week    Attends Religious Services: 1 to 4 times per year    Active Member of Clubs or Organizations: No    Attends Archivist Meetings: Never    Marital Status: Not on file  Intimate Partner Violence: Not At Risk (06/14/2022)   Humiliation, Afraid, Rape, and Kick questionnaire    Fear of Current or Ex-Partner: No    Emotionally Abused: No    Physically Abused: No    Sexually Abused: No    Allergies:  No Known Allergies  Medications: Prior to Admission medications   Medication Sig Start Date End Date Taking? Authorizing Provider  norethindrone (ORTHO MICRONOR) 0.35 MG tablet Take 1 tablet (0.35 mg total) by mouth daily. 02/17/23  Yes Dominic, Nunzio Cobbs, CNM  Prenatal MV & Min w/FA-DHA (PRENATAL ADULT GUMMY/DHA/FA) 0.4-25 MG CHEW Chew 1 tablet by mouth daily. 07/12/22  Yes Philip Aspen, CNM    Physical Exam Blood pressure (!) 100/59, height '5\' 4"'$  (1.626 m), weight 128 lb (58.1 kg), last menstrual period 02/28/2023, currently breastfeeding.    General: NAD HEENT: normocephalic, anicteric Pulmonary: No increased work of breathing Abdomen: NABS, soft, non-tender, non-distended.  Umbilicus without lesions.  No hepatomegaly, splenomegaly or masses palpable. No evidence of hernia. Genitourinary:  Uterus: Non-enlarged, mobile, normal  contour.    Adnexa: ovaries non-enlarged, no adnexal masses Extremities: no edema, erythema, or tenderness Neurologic: Grossly intact Psychiatric: mood appropriate, affect full   Edinburgh Postnatal Depression Scale - 02/28/23 1543       Edinburgh Postnatal Depression Scale:  In the Past 7 Days   I have been able to laugh and see the funny side of things. 0    I have looked forward with enjoyment to things. 0    I have blamed myself unnecessarily when things went wrong. 1    I have been anxious or worried for no good reason. 2    I have felt scared or panicky for no good reason. 1    Things have been getting on top of me. 1    I have been so unhappy that I have had difficulty sleeping. 0    I have felt sad or miserable. 0  I have been so unhappy that I have been crying. 1    The thought of harming myself has occurred to me. 0    Edinburgh Postnatal Depression Scale Total 6             Assessment: 22 y.o. [id number] presenting for 6 week postpartum visit  Plan: Problem List Items Addressed This Visit   None Visit Diagnoses     6 weeks postpartum follow-up    -  Primary        1) Contraception - Education given regarding options for contraception, as well as compatibility with breast feeding if applicable.  Patient plans on oral progesterone-only contraceptive for contraception.  2)  Pap - ASCCP guidelines and rationale discussed.  Patient opts for every 3 years screening interval. Due at next annual exam  3) Patient underwent screening for postpartum depression with no signs of depression. She takes naps when she can and has a good support system.  4) Return in about 1 year (around 02/28/2024) for annual established gyn.   Rod Can, [facility] 02/28/2023, 3:58 PM

## 2023-05-30 IMAGING — CR DG CHEST 2V
1 series · 2 of 2 positions shown · non-contrast
Comparison: None.

CLINICAL DATA: Cough with chest pain.

EXAM:
CHEST - 2 VIEW

[Series 1: dg chest 2 view · 0.14mm/px · 2 of 2 slices shown]
[im 1/2]
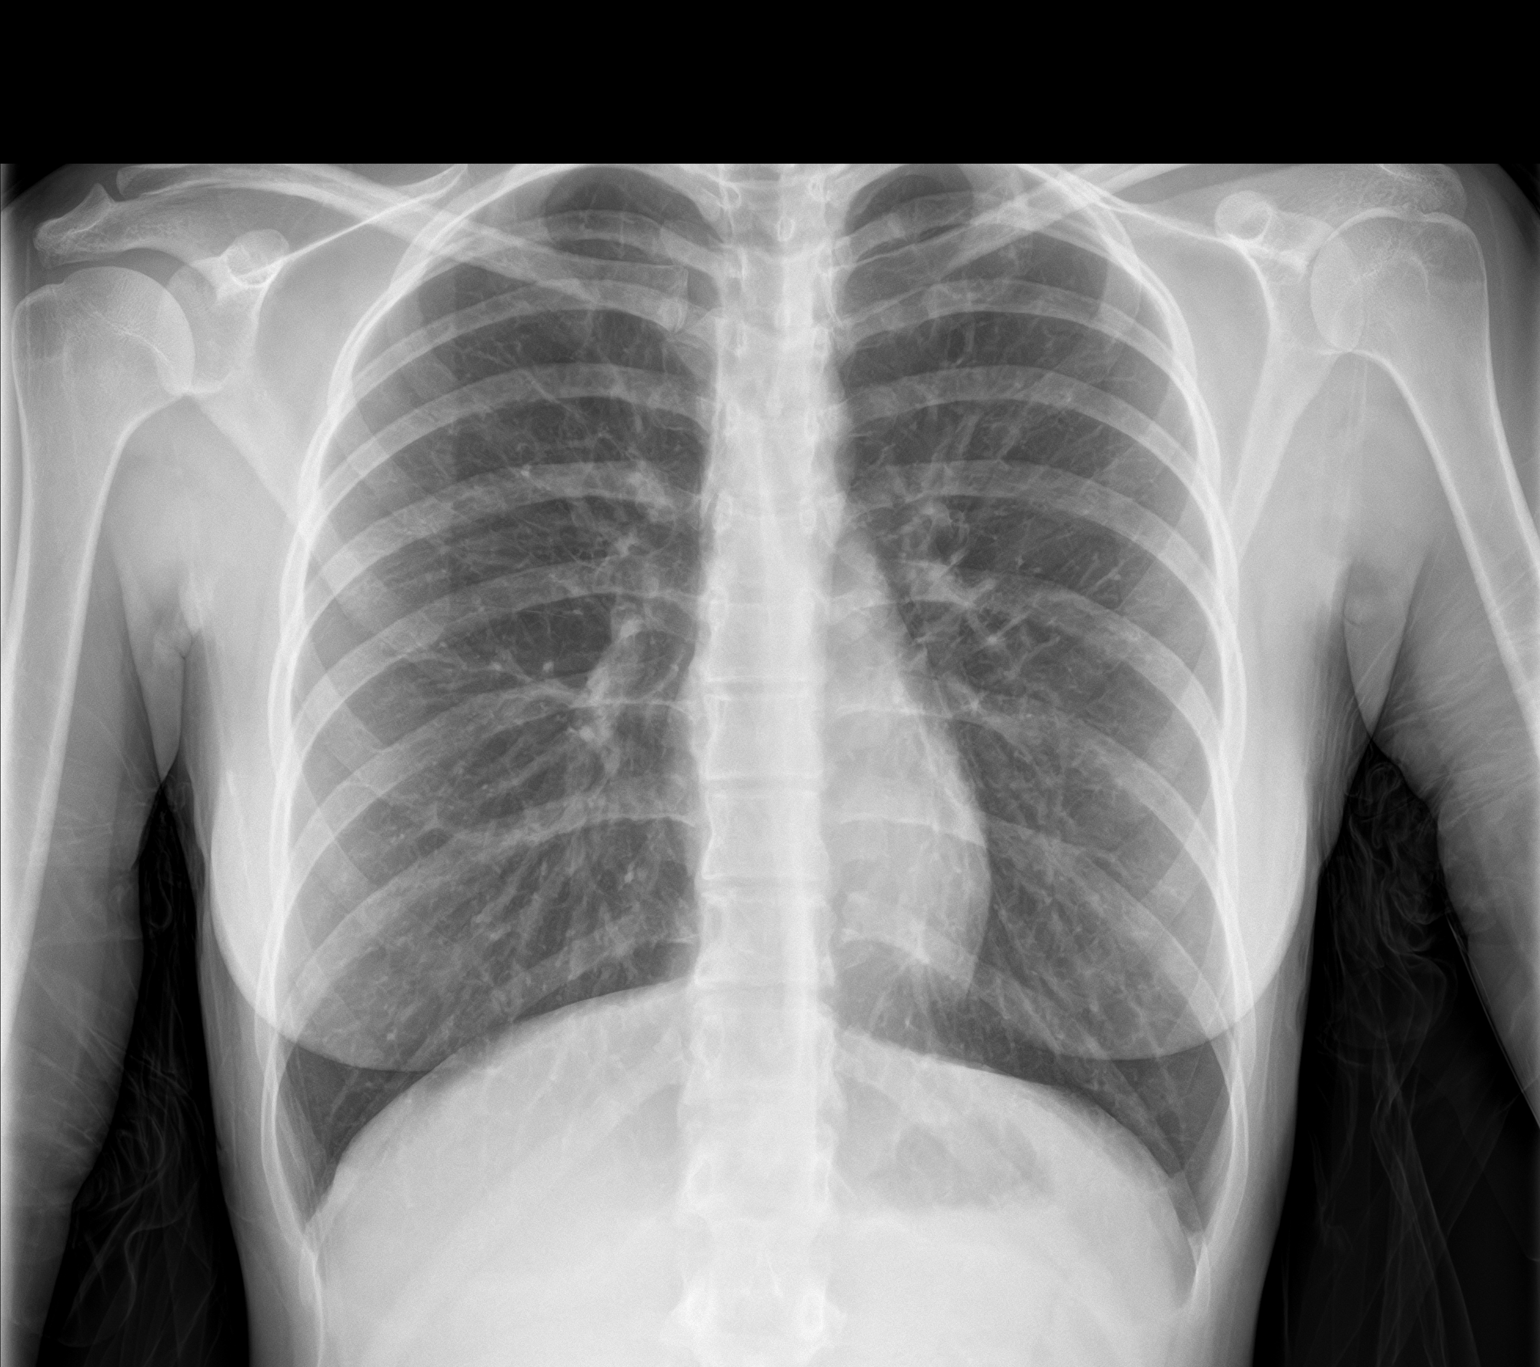
[im 2/2]
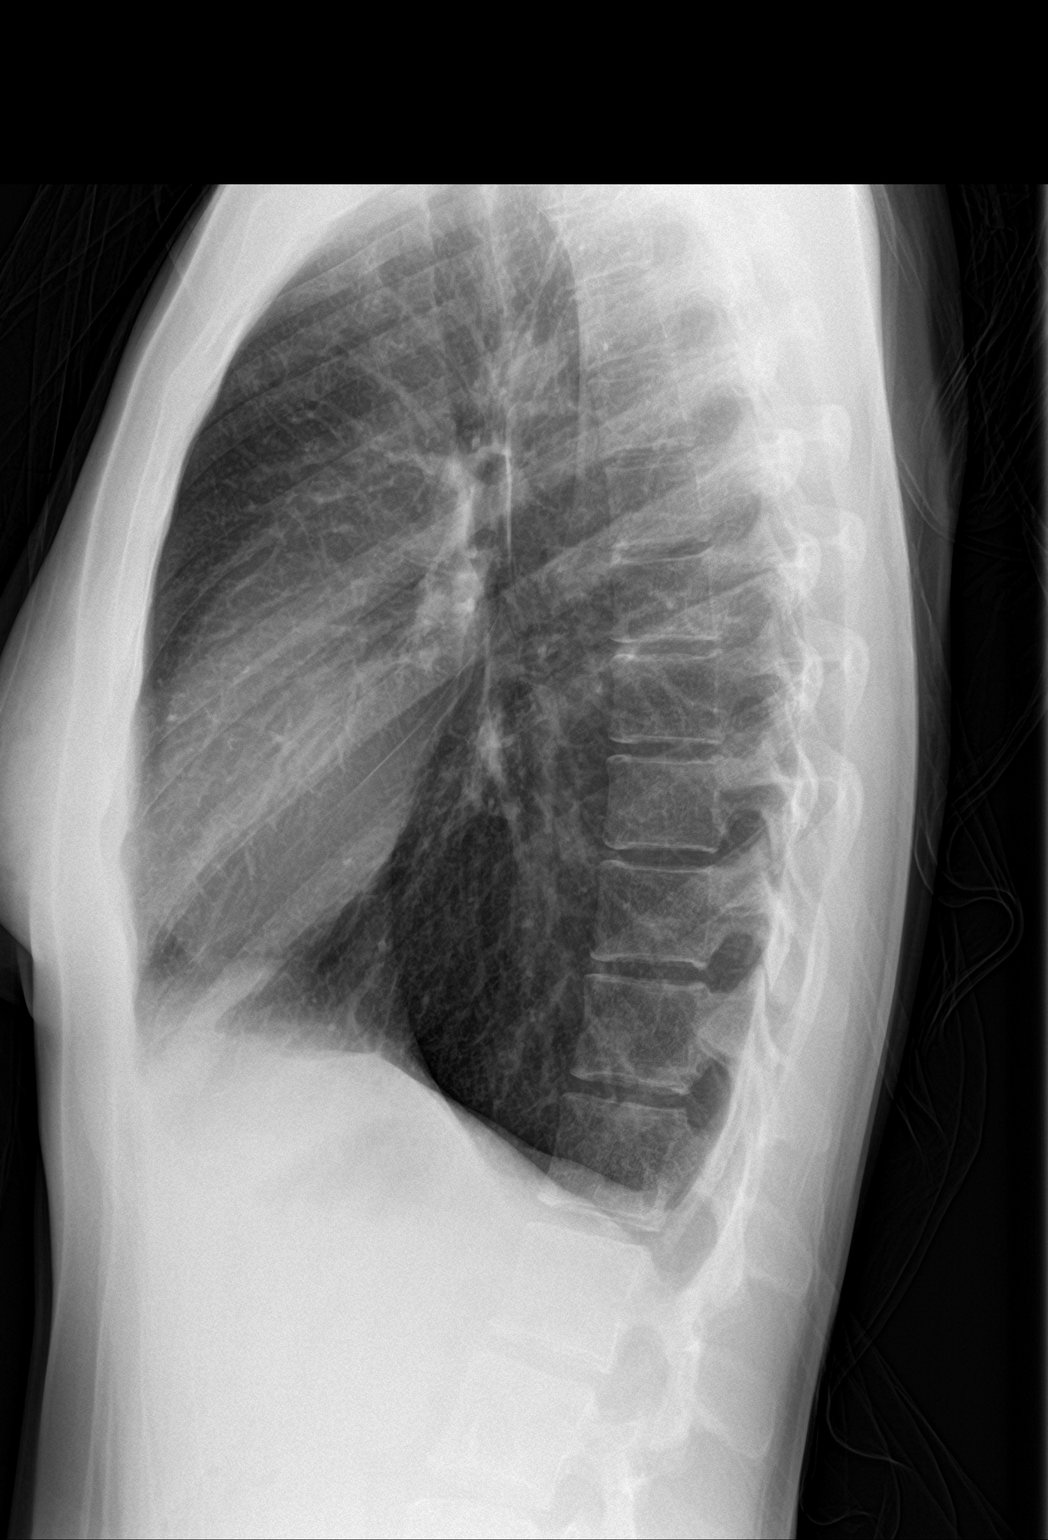

[2 of 2 positions shown; findings below may reference images not displayed]

FINDINGS: The heart size and mediastinal contours are normal. The lungs are
clear. There is no pleural effusion or pneumothorax. No acute
osseous findings are identified.
IMPRESSION: No active cardiopulmonary process.

## 2023-05-30 IMAGING — US US PELVIS COMPLETE TRANSABD/TRANSVAG W DUPLEX AND/OR DOPPLER
1 series · 13 of 25 positions shown · non-contrast
Comparison: No prior non obstetric pelvic ultrasound is available
for comparison

CLINICAL DATA: Pelvic pain. Nausea and vomiting since last night.
Last menstrual period 01/03/2022. Premenopausal.

EXAM:
TRANSABDOMINAL AND TRANSVAGINAL ULTRASOUND OF PELVIS
DOPPLER ULTRASOUND OF OVARIES
TECHNIQUE: Both transabdominal and transvaginal ultrasound examinations of the
pelvis were performed. Transabdominal technique was performed for
global imaging of the pelvis including uterus, ovaries, adnexal
regions, and pelvic cul-de-sac.
It was necessary to proceed with endovaginal exam following the
transabdominal exam to visualize the uterus, endometrium, and
bilateral ovaries. Color and duplex Doppler ultrasound was utilized
to evaluate blood flow to the ovaries.

[Series 1: us pelvic complete w transvaginal and torsion righ · 13 of 158 slices shown]
[im 1/158]
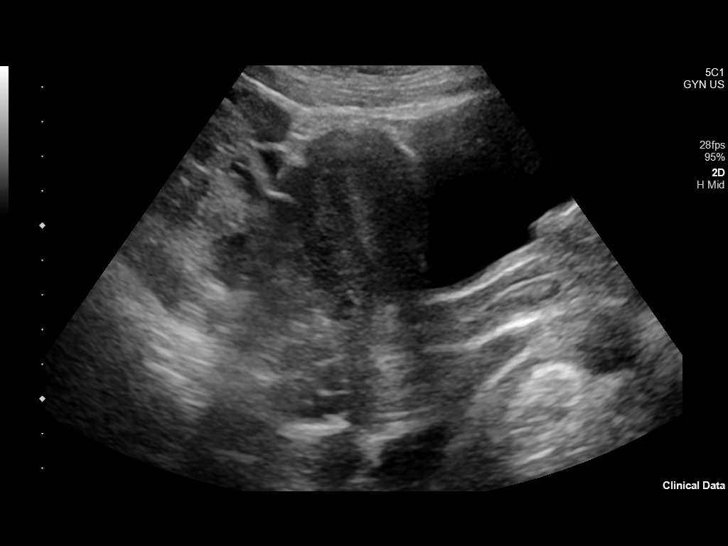
[im 14/158]
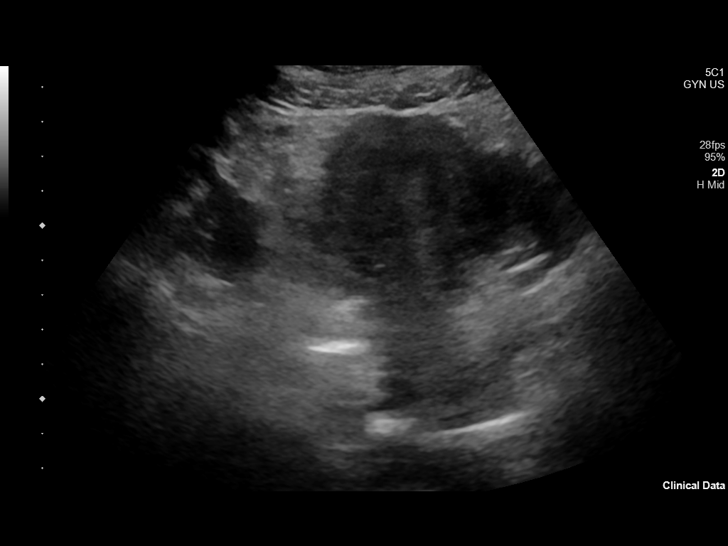
[im 27/158]
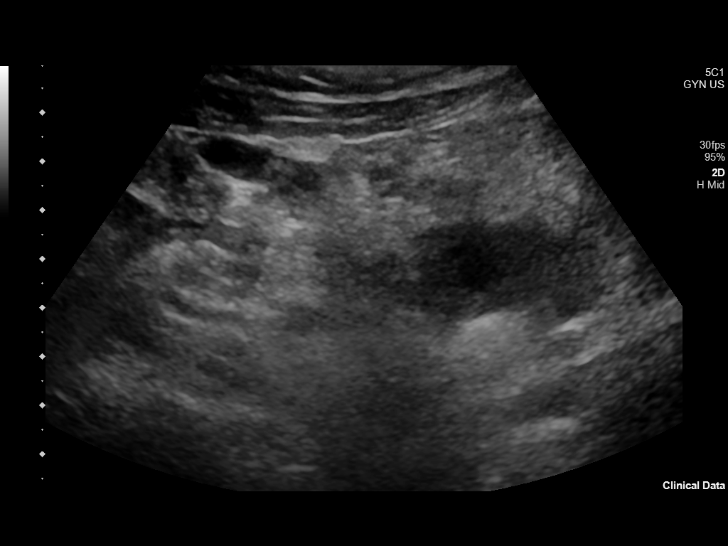
[im 40/158]
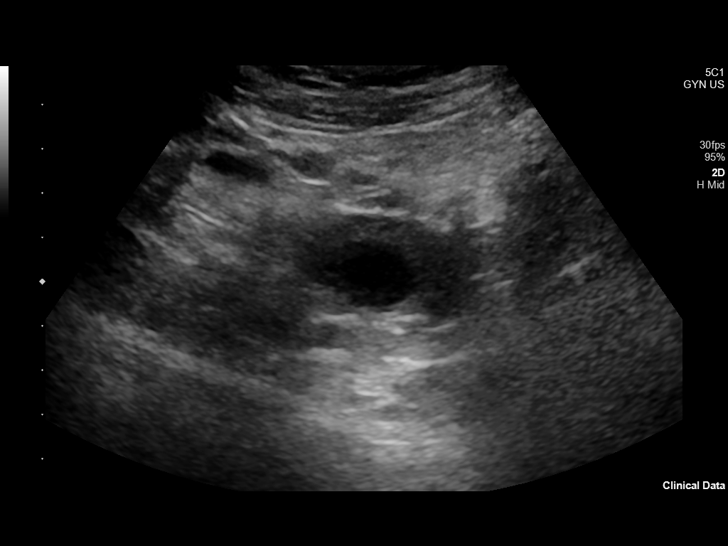
[im 53/158]
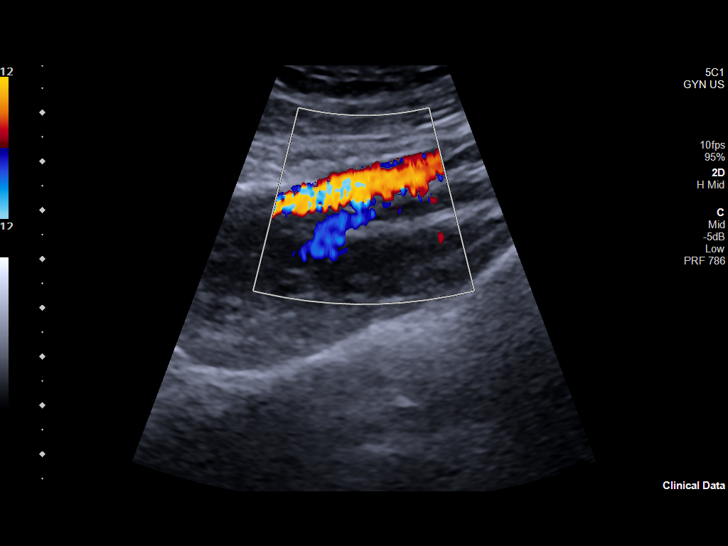
[im 66/158]
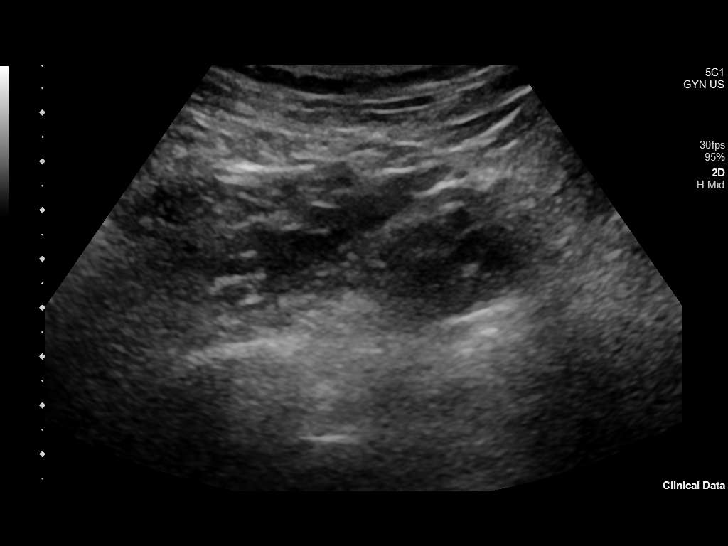
[im 79/158]
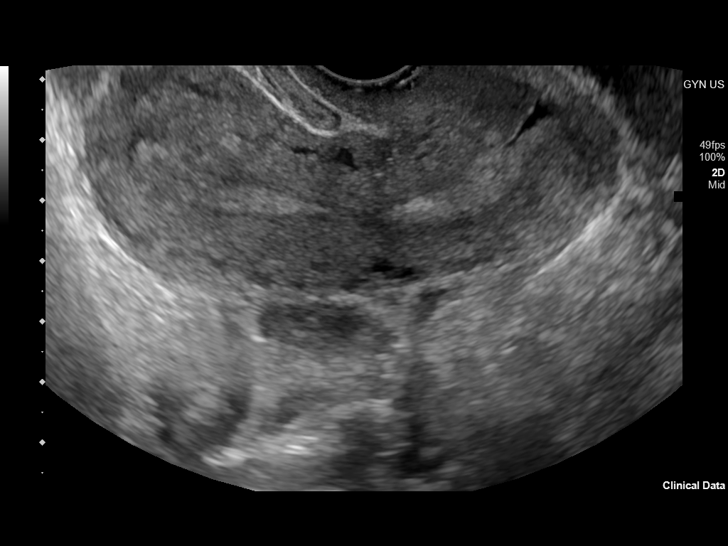
[im 92/158]
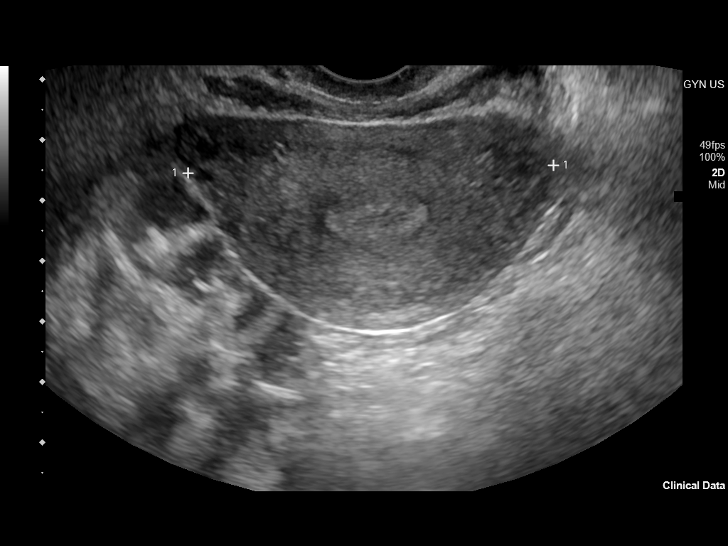
[im 105/158]
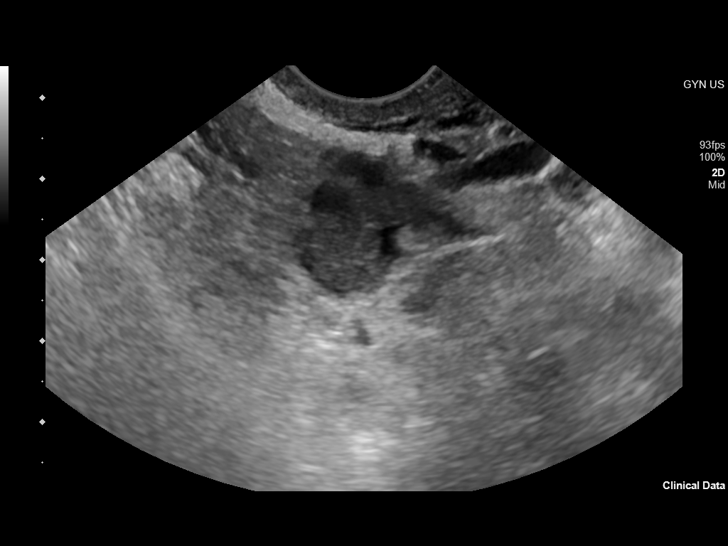
[im 118/158]
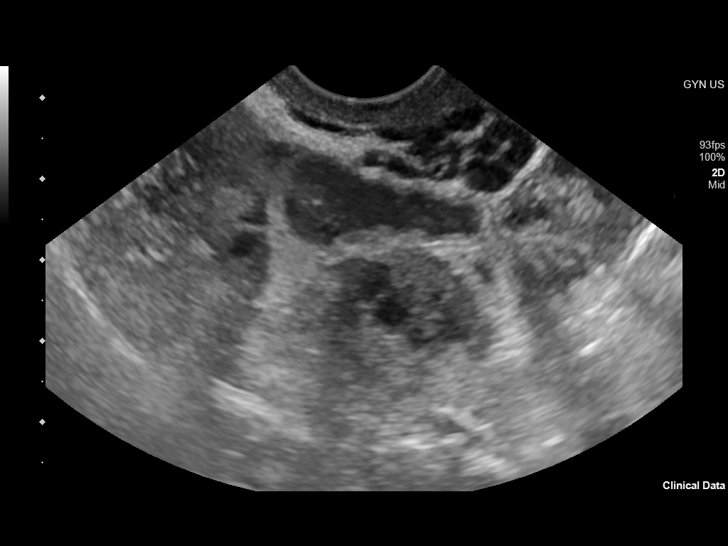
[im 131/158]
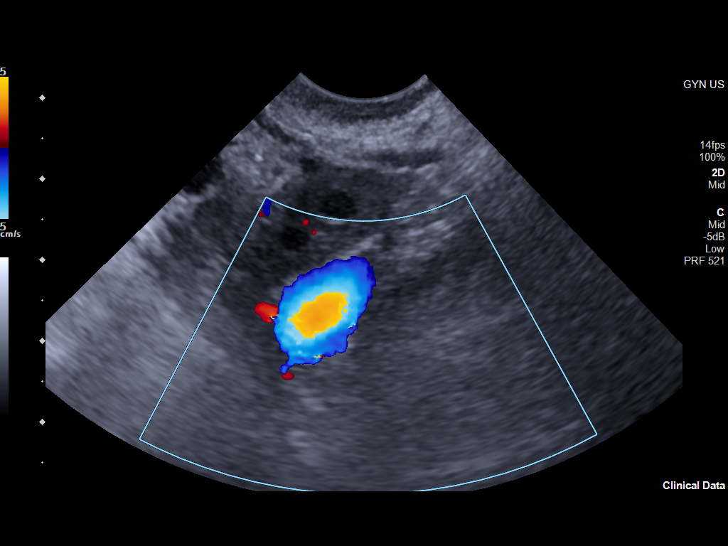
[im 144/158]
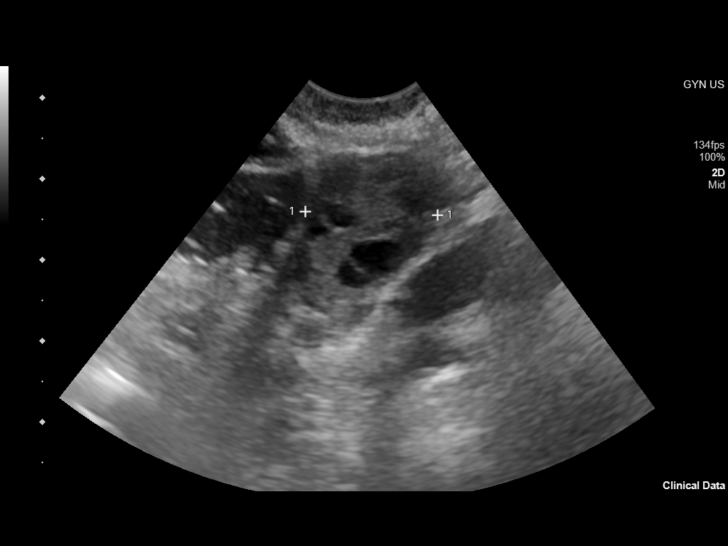
[im 158/158]
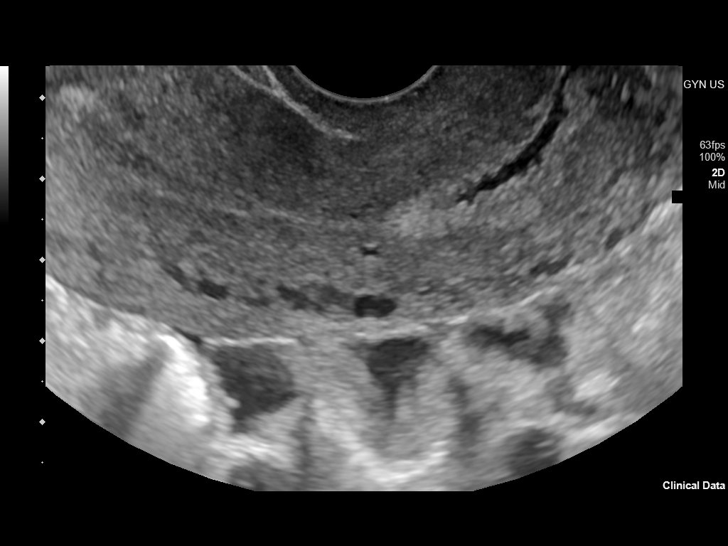

[13 of 25 positions shown; findings below may reference images not displayed]

FINDINGS: Uterus

Measurements: 8.6 x 3.7 x 6.0 cm = volume: 102 mL. No fibroids or
other mass visualized.

Endometrium

Thickness: 6 mm. There is minimal nonspecific fluid seen within the
endometrial canal of the lower uterine segment, measuring only up to
approximately 1 mm in AP dimension.

Right ovary

Measurements: 3.5 x 2.0 x 2.3 cm = volume: 8.3 mL. Normal
appearance/no adnexal mass.

Left ovary

Measurements: 2.9 x 1.6 x 1.6 cm = volume: 4.0 mL. Normal
appearance/no adnexal mass.

Pulsed Doppler evaluation of both ovaries demonstrates normal
low-resistance arterial and venous waveforms.

Other findings

No abnormal free fluid.
IMPRESSION: :
IMPRESSION: 1. There is minimal nonspecific simple fluid seen within the lower
uterine segment of the endometrial canal. Normal endometrial
thickness.
2. Normal appearance of the bilateral ovaries.

## 2023-09-25 ENCOUNTER — Other Ambulatory Visit: Payer: Self-pay

## 2023-09-25 ENCOUNTER — Emergency Department
Admission: EM | Admit: 2023-09-25 | Discharge: 2023-09-25 | Disposition: A | Discharge status: Home / Self Care | Payer: Medicaid Other | Attending: Emergency Medicine | Admitting: Emergency Medicine

## 2023-09-25 DIAGNOSIS — K920 Hematemesis: Secondary | ICD-10-CM | POA: Diagnosis present

## 2023-09-25 DIAGNOSIS — R519 Headache, unspecified: Secondary | ICD-10-CM | POA: Insufficient documentation

## 2023-09-25 DIAGNOSIS — R112 Nausea with vomiting, unspecified: Secondary | ICD-10-CM

## 2023-09-25 LAB — BASIC METABOLIC PANEL
Anion gap: 14 (ref 5–15)
BUN: 22 mg/dL — ABNORMAL HIGH (ref 6–20)
CO2: 18 mmol/L — ABNORMAL LOW (ref 22–32)
Calcium: 9.5 mg/dL (ref 8.9–10.3)
Chloride: 100 mmol/L (ref 98–111)
Creatinine, Ser: 0.77 mg/dL (ref 0.44–1.00)
GFR, Estimated: >60 mL/min (ref >60)
Glucose, Bld: 122 mg/dL — ABNORMAL HIGH (ref 70–99)
Potassium: 3.9 mmol/L (ref 3.5–5.1)
Sodium: 132 mmol/L — ABNORMAL LOW (ref 135–145)

## 2023-09-25 LAB — HEPATIC FUNCTION PANEL
ALT: 18 U/L (ref 0–44)
AST: 24 U/L (ref 15–41)
Albumin: 5.1 g/dL — ABNORMAL HIGH (ref 3.5–5.0)
Alkaline Phosphatase: 82 U/L (ref 38–126)
Bilirubin, Direct: 0.1 mg/dL (ref 0.0–0.2)
Indirect Bilirubin: 1 mg/dL — ABNORMAL HIGH (ref 0.3–0.9)
Total Bilirubin: 1.1 mg/dL (ref 0.3–1.2)
Total Protein: 8.9 g/dL — ABNORMAL HIGH (ref 6.5–8.1)

## 2023-09-25 LAB — CBC
HCT: 44.3 % (ref 36.0–46.0)
Hemoglobin: 15 g/dL (ref 12.0–15.0)
MCH: 29.9 pg (ref 26.0–34.0)
MCHC: 33.9 g/dL (ref 30.0–36.0)
MCV: 88.4 fL (ref 80.0–100.0)
Platelets: 234 10*3/uL (ref 150–400)
RBC: 5.01 MIL/uL (ref 3.87–5.11)
RDW: 12.8 % (ref 11.5–15.5)
WBC: 19.4 10*3/uL — ABNORMAL HIGH (ref 4.0–10.5)
nRBC: 0 % (ref 0.0–0.2)

## 2023-09-25 LAB — LIPASE, BLOOD: Lipase: 23 U/L (ref 11–51)

## 2023-09-25 MED ORDER — ONDANSETRON 4 MG PO TBDP
4.0000 mg | ORAL_TABLET | Freq: Once | ORAL | Status: AC
  Administered 2023-09-25: 4 mg via ORAL

## 2023-09-25 MED ORDER — PROCHLORPERAZINE MALEATE 10 MG PO TABS: 10.0000 mg | ORAL_TABLET | Freq: Four times a day (QID) | ORAL | 0 refills | Status: AC | PRN

## 2023-09-25 MED ORDER — PROCHLORPERAZINE MALEATE 10 MG PO TABS
10.0000 mg | ORAL_TABLET | Freq: Once | ORAL | Status: AC
  Administered 2023-09-25: 10 mg via ORAL
  Filled 2023-09-25: qty 1

## 2023-09-25 MED ORDER — ONDANSETRON 4 MG PO TBDP
ORAL_TABLET | ORAL | Status: AC
  Filled 2023-09-25: qty 1

## 2023-09-25 NOTE — ED Provider Notes (Signed)
[facility] Provider Note    Event Date/Time   First MD Initiated Contact with Patient 09/25/23 1740     (approximate)   History   Hematemesis   HPI  [name] is a 22 y.o. female who presents to the emergency department today because of concerns for hematemesis.  Over the past couple of weeks the patient has had a few episodes where she will have headache at night and then the next morning wake up of vomiting.  She had a similar episode happen last night and then this morning.  However this morning after her first couple episodes of emesis she started noticing some darker red blood in the vomit.  This is accompanied by some abdominal discomfort.  The time my exam her hematemesis has stopped.  The patient has not been able to appreciate any pattern or triggers to these episodes.     Physical Exam   Triage Vital Signs: ED Triage Vitals  Encounter Vitals Group     BP 09/25/23 1525 (!) 117/92     Systolic BP Percentile --      Diastolic BP Percentile --      Pulse Rate 09/25/23 1525 (!) 113     Resp 09/25/23 1525 17     Temp 09/25/23 1525 98.3 F (36.8 C)     Temp Source 09/25/23 1525 Oral     SpO2 09/25/23 1525 98 %     Weight 09/25/23 1526 113 lb (51.3 kg)     Height 09/25/23 1526 5\' 4"  (1.626 m)     Head Circumference --      Peak Flow --      Pain Score 09/25/23 1526 8     Pain Loc --      Pain Education --      Exclude from Growth Chart --     Most recent vital signs: Vitals:   09/25/23 1525  BP: (!) 117/92  Pulse: (!) 113  Resp: 17  Temp: 98.3 F (36.8 C)  SpO2: 98%   General: Awake, alert, oriented. CV:  Good peripheral perfusion. Regular rate and rhythm. Resp:  Normal effort. Lungs clear. Abd:  No distention. Non tender.  ED Results / Procedures / Treatments   Labs (all labs ordered are listed, but only abnormal results are displayed) Labs Reviewed  CBC - Abnormal; Notable for the following components:      Result  Value   WBC 19.4 (*)    All other components within normal limits  BASIC METABOLIC PANEL - Abnormal; Notable for the following components:   Sodium 132 (*)    CO2 18 (*)    Glucose, Bld 122 (*)    BUN 22 (*)    All other components within normal limits  HEPATIC FUNCTION PANEL - Abnormal; Notable for the following components:   Total Protein 8.9 (*)    Albumin 5.1 (*)    Indirect Bilirubin 1.0 (*)    All other components within normal limits  LIPASE, BLOOD     EKG  None   RADIOLOGY None   PROCEDURES:  Critical Care performed: No    MEDICATIONS ORDERED IN ED: Medications  ondansetron (ZOFRAN-ODT) 4 MG disintegrating tablet (has no administration in time range)  ondansetron (ZOFRAN-ODT) disintegrating tablet 4 mg (4 mg Oral Given 09/25/23 1530)     IMPRESSION / MDM / ASSESSMENT AND PLAN / ED COURSE  I reviewed the triage vital signs and the nursing notes.  Differential diagnosis includes, but is not limited to, pancreatitis, cyclic vomiting, gastritis, mallory weiss  Patient's presentation is most consistent with acute presentation with potential threat to life or bodily function.   Patient presented to the emergency department today because of concerns for hematemesis.  On exam patient without any active vomiting.  Abdomen is benign.  Blood work without concerning anemia.  Will add on lipase and hepatic function panel test.  Will give Compazine given concern for possible migraine with headaches and nausea vomiting.  Patient did feel some improvement after the medication. Lipase and hepatic function panel without concerning findings. Do think likely mallory weiss tears causing the bleeding. Will plan on discharging with antiemetics.     FINAL CLINICAL IMPRESSION(S) / ED DIAGNOSES   Final diagnoses:  Bad headache  Nausea and vomiting, unspecified vomiting type  Hematemesis, unspecified whether nausea present     Note:  This  document was prepared using Dragon voice recognition software and may include unintentional dictation errors.    Phineas Semen, MD 09/25/23 [phone]

## 2023-09-25 NOTE — ED Notes (Signed)
 Provided pt with discharge instructions and education. All of pt questions answered. Pt in possession of all belongings. Pt AAOX4 and stable at time of discharge.Pt ambulated w/ steady gait towards ED exit.

## 2023-09-25 NOTE — Discharge Instructions (Signed)
Please seek medical attention for any high fevers, chest pain, shortness of breath, change in behavior, persistent vomiting, bloody stool or any other new or concerning symptoms.  

## 2023-09-25 NOTE — ED Triage Notes (Addendum)
Pt presents to ED with c/o of vomiting blood that started around 0700. Pt states blood tinged vomit, pt does endorse "lots of hard vomiting". NAD noted.   Pt endorses no more blood in vomit.

## 2024-04-06 ENCOUNTER — Ambulatory Visit: Admitting: Licensed Practical Nurse

## 2024-04-08 ENCOUNTER — Ambulatory Visit (INDEPENDENT_AMBULATORY_CARE_PROVIDER_SITE_OTHER): Admitting: Licensed Practical Nurse

## 2024-04-08 VITALS — BP 99/65 | HR 58 | Wt 117.2 lb

## 2024-04-08 DIAGNOSIS — Z30011 Encounter for initial prescription of contraceptive pills: Secondary | ICD-10-CM | POA: Diagnosis not present

## 2024-04-08 MED ORDER — DROSPIRENONE-ETHINYL ESTRADIOL 3-0.02 MG PO TABS
1.0000 | ORAL_TABLET | Freq: Every day | ORAL | 11 refills | Status: AC
Start: 2024-04-08 — End: ?

## 2024-04-08 NOTE — Progress Notes (Signed)
 Pcp, No   Chief Complaint  Patient presents with   Contraception    HPI:      Haley Robles is a 23 y.o. [id number] whose LMP was Patient's last menstrual period was 03/03/2024 (approximate)., presents today for birth control management. Haley Robles has been on POP since March 2024. Initially she did not have cycles as she was also breastfeeding. She has stopped breastfeeding, her cycles have returned. She has been bleeding since March. She would like this stop. In the past she has used Depo and skyla and had bleeding with both of those. She has used OCP's in the past without any problems. She does smoke MJ but denies tobacco/nicotine , hx CHTN or Migraines with aura.     Patient Active Problem List   Diagnosis Date Noted   Encounter for care or examination of lactating mother 01/16/2023   History of marijuana use 04/04/2020   Anxiety and depression 04/04/2020   PCOS (polycystic ovarian syndrome) 01/29/2018   Seasonal allergic rhinitis due to pollen 05/07/2017    Past Surgical History:  Procedure Laterality Date   NO PAST SURGERIES      Family History  Problem Relation Age of Onset   Diabetes Mother        prediabetic   Cancer Father 110       testicular   Pancreatitis Father    Cholelithiasis Father    Ulcers Father    Ulcers Maternal Grandmother    Cancer Maternal Grandfather        mouth   Diabetes Maternal Grandfather    Cholelithiasis Maternal Grandfather    Aneurysm Paternal Grandmother        brain   Cancer Paternal Grandfather        lung    Social History   Socioeconomic History   Marital status: Significant Other    Spouse name: Eddie Good   Number of children: 1   Years of education: 12   Highest education level: Not on file  Occupational History   Occupation: stay at home mom  Tobacco Use   Smoking status: Never   Smokeless tobacco: Never  Vaping Use   Vaping status: Never Used  Substance and Sexual Activity   Alcohol use: Not Currently    Comment:  occasionally   Drug use: Yes    Types: Marijuana    Comment: at start of pregnancy   Sexual activity: Yes    Partners: Male    Birth control/protection: Injection, None  Other Topics Concern   Not on file  Social History Narrative   6th grade   Social Drivers of Health   Financial Resource Strain: Medium Risk (06/14/2022)   Overall Financial Resource Strain (CARDIA)    Difficulty of Paying Living Expenses: Somewhat hard  Food Insecurity: Food Insecurity Present (06/14/2022)   Hunger Vital Sign    Worried About Running Out of Food in the Last Year: Sometimes true    Ran Out of Food in the Last Year: Sometimes true  Transportation Needs: No Transportation Needs (06/14/2022)   PRAPARE - Administrator, Civil Service (Medical): No    Lack of Transportation (Non-Medical): No  Physical Activity: Insufficiently Active (06/14/2022)   Exercise Vital Sign    Days of Exercise per Week: 2 days    Minutes of Exercise per Session: 20 min  Stress: Stress Concern Present (06/14/2022)   [employer] of Occupational Health - Occupational Stress Questionnaire    Feeling of Stress : To some  extent  Social Connections: Unknown (06/14/2022)   Social Connection and Isolation Panel [NHANES]    Frequency of Communication with Friends and Family: More than three times a week    Frequency of Social Gatherings with Friends and Family: Three times a week    Attends Religious Services: 1 to 4 times per year    Active Member of Clubs or Organizations: No    Attends Banker Meetings: Never    Marital Status: Not on file  Intimate Partner Violence: Not At Risk (06/14/2022)   Humiliation, Afraid, Rape, and Kick questionnaire    Fear of Current or Ex-Partner: No    Emotionally Abused: No    Physically Abused: No    Sexually Abused: No    Outpatient Medications Prior to Visit  Medication Sig Dispense Refill   prochlorperazine  (COMPAZINE ) 10 MG tablet Take 1 tablet (10 mg total) by  mouth every 6 (six) hours as needed for nausea or vomiting. 15 tablet 0   norethindrone  (ORTHO MICRONOR ) 0.35 MG tablet Take 1 tablet (0.35 mg total) by mouth daily. 28 tablet 11   Prenatal MV & Min w/FA-DHA (PRENATAL ADULT GUMMY/DHA/FA) 0.4-25 MG CHEW Chew 1 tablet by mouth daily. (Patient not taking: Reported on 04/08/2024) 30 tablet 11   No facility-administered medications prior to visit.      ROS:  Review of Systems see HPI    OBJECTIVE:   Vitals:  BP 99/65 (BP Location: Right Arm, Patient Position: Sitting, Cuff Size: Normal)   Pulse (!) 58   Wt 117 lb 3.2 oz (53.2 kg)   LMP 03/03/2024 (Approximate)   BMI 20.12 kg/m   Physical Exam Constitutional:      Appearance: Normal appearance.  Cardiovascular:     Rate and Rhythm: Normal rate.  Pulmonary:     Effort: Pulmonary effort is normal.  Musculoskeletal:     Cervical back: Normal range of motion.  Neurological:     Mental Status: She is alert.  Psychiatric:        Mood and Affect: Mood normal.   PE not necessary for purpose  of visit   Results: No results found for this or any previous visit (from the past 24 hours).   Assessment/Plan: Encounter for initial prescription of contraceptive pills - Plan: drospirenone -ethinyl estradiol  (YAZ) 3-0.02 MG tablet    Meds ordered this encounter  Medications   drospirenone -ethinyl estradiol  (YAZ) 3-0.02 MG tablet    Sig: Take 1 tablet by mouth daily.    Dispense:  28 tablet    Refill:  11    -risk/benefits/side effects of OCP's reviewed.  -pt to schedule annual exam   Berkley Breech [facility], CNM 04/08/2024 11:17 AM

## 2024-04-30 ENCOUNTER — Other Ambulatory Visit (HOSPITAL_COMMUNITY)
Admission: RE | Admit: 2024-04-30 | Discharge: 2024-04-30 | Disposition: A | Discharge status: Home / Self Care | Source: Ambulatory Visit | Attending: Licensed Practical Nurse | Admitting: Licensed Practical Nurse

## 2024-04-30 ENCOUNTER — Ambulatory Visit: Admitting: Licensed Practical Nurse

## 2024-04-30 ENCOUNTER — Encounter: Payer: Self-pay | Admitting: Licensed Practical Nurse

## 2024-04-30 VITALS — BP 108/78 | HR 63 | Ht 64.0 in | Wt 114.5 lb

## 2024-04-30 DIAGNOSIS — Z124 Encounter for screening for malignant neoplasm of cervix: Secondary | ICD-10-CM

## 2024-04-30 DIAGNOSIS — Z113 Encounter for screening for infections with a predominantly sexual mode of transmission: Secondary | ICD-10-CM | POA: Insufficient documentation

## 2024-04-30 DIAGNOSIS — Z01419 Encounter for gynecological examination (general) (routine) without abnormal findings: Secondary | ICD-10-CM

## 2024-04-30 NOTE — Progress Notes (Signed)
 Gynecology Annual Exam  PCP: Pcp, No  Chief Complaint:  Chief Complaint  Patient presents with   Gynecologic Exam    History of Present Illness: Patient is a 23 y.o. Haley Robles presents for annual exam. The patient has no complaints today.   LMP: Patient's last menstrual period was 04/08/2024 (approximate). Dene was seen in April for a prolonged period, she was prescribed Yaz. She a cycle just after that visit.  She noted some bruising on her thigh just after starting Yaz   The patient is sexually active with 1 female partner. She currently uses OCP (estrogen/progesterone) for contraception. She denies dyspareunia (she did note some discomfort on her left labia after IC last week).  The patient does not perform self breast exams.  There is notable family history of cervical  cancer in her family.  The patient wears seatbelts: yes.  The patient has regular exercise: busy momma.    The patient repots current symptoms of depression.  Currently stable [facility]  Lives with partner and their 2 children ages 12 and 1, feels safe  PCP: used to go to [facility] but her provider is no longer there Dentist has not been in years Eye exam has not had in years   Review of Systems: Review of Systems  Constitutional: Negative.   HENT: Negative.    Eyes: Negative.   Respiratory: Negative.    Cardiovascular: Negative.   Gastrointestinal: Negative.   Genitourinary: Negative.   Musculoskeletal: Negative.   Skin: Negative.   Neurological: Negative.   Endo/Heme/Allergies:  Bruises/bleeds easily.  Psychiatric/Behavioral:  Positive for depression. The patient is nervous/anxious.     Past Medical History:  Patient Active Problem List   Diagnosis Date Noted Date Diagnosed   History of marijuana use 04/04/2020    Anxiety and depression 04/04/2020    PCOS (polycystic ovarian syndrome) 01/29/2018    Seasonal allergic rhinitis due to pollen 05/07/2017     Past Surgical History:  Past Surgical History:   Procedure Laterality Date   NO PAST SURGERIES      Gynecologic History:  Patient's last menstrual period was 04/08/2024 (approximate). Contraception: OCP (estrogen/progesterone) Last Pap: Results were: 2021 no abnormalities   Obstetric History: [id number]  Family History:  Family History  Problem Relation Age of Onset   Diabetes Mother        prediabetic   Cancer Father 74       testicular   Pancreatitis Father    Cholelithiasis Father    Ulcers Father    Ulcers Maternal Grandmother    Cancer Maternal Grandfather        mouth   Diabetes Maternal Grandfather    Cholelithiasis Maternal Grandfather    Aneurysm Paternal Grandmother        brain   Cancer Paternal Grandfather        lung    Social History:  Social History   Socioeconomic History   Marital status: Significant Other    Spouse name: Eddie Good   Number of children: 1   Years of education: 12   Highest education level: Not on file  Occupational History   Occupation: stay at home mom  Tobacco Use   Smoking status: Never   Smokeless tobacco: Never  Vaping Use   Vaping status: Never Used  Substance and Sexual Activity   Alcohol use: Not Currently    Comment: occasionally   Drug use: Yes    Types: Marijuana    Comment: at start of pregnancy  Sexual activity: Yes    Partners: Male    Birth control/protection: Pill  Other Topics Concern   Not on file  Social History Narrative   6th grade   Social Drivers of Health   Financial Resource Strain: Medium Risk (06/14/2022)   Overall Financial Resource Strain (CARDIA)    Difficulty of Paying Living Expenses: Somewhat hard  Food Insecurity: Food Insecurity Present (06/14/2022)   Hunger Vital Sign    Worried About Running Out of Food in the Last Year: Sometimes true    Ran Out of Food in the Last Year: Sometimes true  Transportation Needs: No Transportation Needs (06/14/2022)   PRAPARE - Administrator, Civil Service (Medical): No    Lack of  Transportation (Non-Medical): No  Physical Activity: Insufficiently Active (06/14/2022)   Exercise Vital Sign    Days of Exercise per Week: 2 days    Minutes of Exercise per Session: 20 min  Stress: Stress Concern Present (06/14/2022)   [employer] of Occupational Health - Occupational Stress Questionnaire    Feeling of Stress : To some extent  Social Connections: Unknown (06/14/2022)   Social Connection and Isolation Panel [NHANES]    Frequency of Communication with Friends and Family: More than three times a week    Frequency of Social Gatherings with Friends and Family: Three times a week    Attends Religious Services: 1 to 4 times per year    Active Member of Clubs or Organizations: No    Attends Banker Meetings: Never    Marital Status: Not on file  Intimate Partner Violence: Not At Risk (06/14/2022)   Humiliation, Afraid, Rape, and Kick questionnaire    Fear of Current or Ex-Partner: No    Emotionally Abused: No    Physically Abused: No    Sexually Abused: No    Allergies:  No Known Allergies  Medications: Prior to Admission medications   Medication Sig Start Date End Date Taking? Authorizing Provider  drospirenone -ethinyl estradiol  (YAZ) 3-0.02 MG tablet Take 1 tablet by mouth daily. 04/08/24  Yes Tamy Accardo, Alva Jewels, CNM  prochlorperazine  (COMPAZINE ) 10 MG tablet Take 1 tablet (10 mg total) by mouth every 6 (six) hours as needed for nausea or vomiting. 09/25/23  Yes Marylynn Soho, MD    Physical Exam Vitals: Blood pressure 108/78, pulse 63, height 5\' 4"  (1.626 m), weight 114 lb 8 oz (51.9 kg), last menstrual period 04/08/2024, currently breastfeeding.  General: NAD HEENT: normocephalic, anicteric Thyroid: no enlargement, no palpable nodules Pulmonary: No increased work of breathing, CTAB Cardiovascular: RRR, distal pulses 2+ Breast: Breast symmetrical, no tenderness, no palpable nodules or masses, no skin or nipple retraction present, no nipple  discharge.  No axillary or supraclavicular lymphadenopathy. Abdomen: NABS, soft, non-tender, non-distended.  Umbilicus without lesions.  No hepatomegaly, splenomegaly or masses palpable. No evidence of hernia  Genitourinary:  External: Normal external female genitalia,  left labial: line of redness along labia, appears to be healing abrasion.  Normal urethral meatus, normal Bartholin's and Skene's glands.    Vagina: Normal vaginal mucosa, no evidence of prolapse.  Good tone   Cervix: Grossly normal in appearance, no bleeding  Uterus: Non-enlarged, mobile, normal contour.  No CMT  Adnexa: ovaries non-enlarged, no adnexal masses  Rectal: deferred  Lymphatic: no evidence of inguinal lymphadenopathy Extremities: no edema, erythema, or tenderness Neurologic: Grossly intact Psychiatric: mood appropriate, affect full   Assessment: 23 y.o. [id number] routine annual exam  Plan: Problem List Items Addressed This Visit  None Visit Diagnoses       Well woman exam    -  Primary   Relevant Orders   HEP, RPR, HIV Panel   CBC w/Diff/Platelet   Cytology - PAP   Hepatitis C Antibody     Screening examination for venereal disease       Relevant Orders   HEP, RPR, HIV Panel   Cytology - PAP   Hepatitis C Antibody     Cervical cancer screening       Relevant Orders   Cytology - PAP       1) 4) Gardasil Series discussed and if applicable offered to patient - Patient unsure previously completed 3 shot series, HO given.   2) STI screening  wasoffered and accepted  3)  ASCCP guidelines and rational discussed.  Patient opts for every 3 years screening interval  4) Contraception - the patient is currently using  OCP (estrogen/progesterone).  She is happy with her current form of contraception and plans to continue We discussed safe sex practices to reduce her furture risk of STI's.    5) Return in about 1 year (around 04/30/2025).   Anice Kerbs, CNM   [facility] 04/30/2024,  9:15 AM

## 2024-05-01 LAB — CBC WITH DIFFERENTIAL/PLATELET
Basophils Absolute: 0.1 10*3/uL (ref 0.0–0.2)
Basos: 0 %
EOS (ABSOLUTE): 0.2 10*3/uL (ref 0.0–0.4)
Eos: 2 %
Hematocrit: 42.4 % (ref 34.0–46.6)
Hemoglobin: 13.3 g/dL (ref 11.1–15.9)
Immature Grans (Abs): 0 10*3/uL (ref 0.0–0.1)
Immature Granulocytes: 0 %
Lymphocytes Absolute: 2.1 10*3/uL (ref 0.7–3.1)
Lymphs: 19 %
MCH: 29.6 pg (ref 26.6–33.0)
MCHC: 31.4 g/dL — ABNORMAL LOW (ref 31.5–35.7)
MCV: 94 fL (ref 79–97)
Monocytes Absolute: 0.9 10*3/uL (ref 0.1–0.9)
Monocytes: 8 %
Neutrophils Absolute: 7.9 10*3/uL — ABNORMAL HIGH (ref 1.4–7.0)
Neutrophils: 71 %
Platelets: 244 10*3/uL (ref 150–450)
RBC: 4.49 x10E6/uL (ref 3.77–5.28)
RDW: 12 % (ref 11.7–15.4)
WBC: 11.2 10*3/uL — ABNORMAL HIGH (ref 3.4–10.8)

## 2024-05-01 LAB — HEP, RPR, HIV PANEL
HIV Screen 4th Generation wRfx: NONREACTIVE
Hepatitis B Surface Ag: NEGATIVE
RPR Ser Ql: NONREACTIVE

## 2024-05-01 LAB — HEPATITIS C ANTIBODY: Hep C Virus Ab: NONREACTIVE

## 2024-05-06 ENCOUNTER — Encounter: Payer: Self-pay | Admitting: Licensed Practical Nurse

## 2024-05-06 LAB — CYTOLOGY - PAP
Adequacy: ABSENT
Chlamydia: NEGATIVE
Comment: NEGATIVE
Comment: NEGATIVE
Comment: NEGATIVE
Comment: NORMAL
Diagnosis: UNDETERMINED — AB
High risk HPV: NEGATIVE
Neisseria Gonorrhea: NEGATIVE
Trichomonas: NEGATIVE

## 2024-05-08 ENCOUNTER — Ambulatory Visit: Payer: Self-pay | Admitting: Licensed Practical Nurse

## 2024-12-06 ENCOUNTER — Encounter: Payer: Self-pay | Admitting: Licensed Practical Nurse

## 2024-12-07 ENCOUNTER — Emergency Department

## 2024-12-07 ENCOUNTER — Other Ambulatory Visit: Payer: Self-pay

## 2024-12-07 ENCOUNTER — Observation Stay
Admission: EM | Admit: 2024-12-07 | Discharge: 2024-12-08 | Disposition: A | Discharge status: Home / Self Care | Attending: Internal Medicine | Admitting: Internal Medicine

## 2024-12-07 DIAGNOSIS — F32A Depression, unspecified: Secondary | ICD-10-CM | POA: Diagnosis not present

## 2024-12-07 DIAGNOSIS — N390 Urinary tract infection, site not specified: Principal | ICD-10-CM | POA: Insufficient documentation

## 2024-12-07 DIAGNOSIS — R112 Nausea with vomiting, unspecified: Secondary | ICD-10-CM

## 2024-12-07 DIAGNOSIS — F419 Anxiety disorder, unspecified: Secondary | ICD-10-CM | POA: Insufficient documentation

## 2024-12-07 DIAGNOSIS — E282 Polycystic ovarian syndrome: Secondary | ICD-10-CM | POA: Insufficient documentation

## 2024-12-07 DIAGNOSIS — N3001 Acute cystitis with hematuria: Secondary | ICD-10-CM

## 2024-12-07 DIAGNOSIS — E1165 Type 2 diabetes mellitus with hyperglycemia: Secondary | ICD-10-CM | POA: Diagnosis not present

## 2024-12-07 DIAGNOSIS — N3000 Acute cystitis without hematuria: Principal | ICD-10-CM

## 2024-12-07 LAB — BASIC METABOLIC PANEL WITH GFR
Anion gap: 16 — ABNORMAL HIGH (ref 5–15)
BUN: 12 mg/dL (ref 6–20)
CO2: 16 mmol/L — ABNORMAL LOW (ref 22–32)
Calcium: 8.3 mg/dL — ABNORMAL LOW (ref 8.9–10.3)
Chloride: 103 mmol/L (ref 98–111)
Creatinine, Ser: 0.66 mg/dL (ref 0.44–1.00)
GFR, Estimated: >60 mL/min
Glucose, Bld: 188 mg/dL — ABNORMAL HIGH (ref 70–99)
Potassium: 3.5 mmol/L (ref 3.5–5.1)
Sodium: 135 mmol/L (ref 135–145)

## 2024-12-07 LAB — CBC WITH DIFFERENTIAL/PLATELET
Abs Immature Granulocytes: 0.06 K/uL (ref 0.00–0.07)
Basophils Absolute: 0.1 K/uL (ref 0.0–0.1)
Basophils Relative: 0 %
Eosinophils Absolute: 0.1 K/uL (ref 0.0–0.5)
Eosinophils Relative: 1 %
HCT: 35.7 % — ABNORMAL LOW (ref 36.0–46.0)
Hemoglobin: 12.1 g/dL (ref 12.0–15.0)
Immature Granulocytes: 0 %
Lymphocytes Relative: 8 %
Lymphs Abs: 1.5 K/uL (ref 0.7–4.0)
MCH: 30.2 pg (ref 26.0–34.0)
MCHC: 33.9 g/dL (ref 30.0–36.0)
MCV: 89 fL (ref 80.0–100.0)
Monocytes Absolute: 0.9 K/uL (ref 0.1–1.0)
Monocytes Relative: 5 %
Neutro Abs: 15.1 K/uL — ABNORMAL HIGH (ref 1.7–7.7)
Neutrophils Relative %: 86 %
Platelets: 184 K/uL (ref 150–400)
RBC: 4.01 MIL/uL (ref 3.87–5.11)
RDW: 12.4 % (ref 11.5–15.5)
WBC: 17.7 K/uL — ABNORMAL HIGH (ref 4.0–10.5)
nRBC: 0 % (ref 0.0–0.2)

## 2024-12-07 LAB — BLOOD GAS, VENOUS
Acid-base deficit: 8 mmol/L — ABNORMAL HIGH (ref 0.0–2.0)
Bicarbonate: 15.4 mmol/L — ABNORMAL LOW (ref 20.0–28.0)
O2 Saturation: 86 %
Patient temperature: 37
pCO2, Ven: 26 mmHg — ABNORMAL LOW (ref 44–60)
pH, Ven: 7.38 (ref 7.25–7.43)
pO2, Ven: 55 mmHg — ABNORMAL HIGH (ref 32–45)

## 2024-12-07 LAB — URINALYSIS, ROUTINE W REFLEX MICROSCOPIC
Bilirubin Urine: NEGATIVE
Glucose, UA: >=500 mg/dL — AB
Ketones, ur: 80 mg/dL — AB
Leukocytes,Ua: NEGATIVE
Nitrite: NEGATIVE
Protein, ur: 30 mg/dL — AB
Specific Gravity, Urine: 1.021 (ref 1.005–1.030)
pH: 6 (ref 5.0–8.0)

## 2024-12-07 LAB — BETA-HYDROXYBUTYRIC ACID
Beta-Hydroxybutyric Acid: 1.79 mmol/L — ABNORMAL HIGH (ref 0.05–0.27)
Beta-Hydroxybutyric Acid: 2 mmol/L — ABNORMAL HIGH (ref 0.05–0.27)

## 2024-12-07 LAB — HCG, QUANTITATIVE, PREGNANCY: hCG, Beta Chain, Quant, S: <1 m[IU]/mL

## 2024-12-07 LAB — COMPREHENSIVE METABOLIC PANEL WITH GFR
ALT: 16 U/L (ref 0–44)
AST: 26 U/L (ref 15–41)
Albumin: 4.9 g/dL (ref 3.5–5.0)
Alkaline Phosphatase: 72 U/L (ref 38–126)
Anion gap: 22 — ABNORMAL HIGH (ref 5–15)
BUN: 13 mg/dL (ref 6–20)
CO2: 15 mmol/L — ABNORMAL LOW (ref 22–32)
Calcium: 9.1 mg/dL (ref 8.9–10.3)
Chloride: 98 mmol/L (ref 98–111)
Creatinine, Ser: 0.88 mg/dL (ref 0.44–1.00)
GFR, Estimated: >60 mL/min
Glucose, Bld: 232 mg/dL — ABNORMAL HIGH (ref 70–99)
Potassium: 3.4 mmol/L — ABNORMAL LOW (ref 3.5–5.1)
Sodium: 136 mmol/L (ref 135–145)
Total Bilirubin: 0.9 mg/dL (ref 0.0–1.2)
Total Protein: 8.3 g/dL — ABNORMAL HIGH (ref 6.5–8.1)

## 2024-12-07 LAB — GLUCOSE, CAPILLARY
Glucose-Capillary: 80 mg/dL (ref 70–99)
Glucose-Capillary: 122 mg/dL — ABNORMAL HIGH (ref 70–99)

## 2024-12-07 LAB — CREATININE, SERUM
Creatinine, Ser: 0.63 mg/dL (ref 0.44–1.00)
GFR, Estimated: >60 mL/min

## 2024-12-07 LAB — HIV ANTIBODY (ROUTINE TESTING W REFLEX): HIV Screen 4th Generation wRfx: NONREACTIVE

## 2024-12-07 LAB — RESP PANEL BY RT-PCR (RSV, FLU A&B, COVID)  RVPGX2
Influenza A by PCR: NEGATIVE
Influenza B by PCR: NEGATIVE
Resp Syncytial Virus by PCR: NEGATIVE
SARS Coronavirus 2 by RT PCR: NEGATIVE

## 2024-12-07 LAB — LACTIC ACID, PLASMA
Lactic Acid, Venous: 2.7 mmol/L (ref 0.5–1.9)
Lactic Acid, Venous: 1.1 mmol/L (ref 0.5–1.9)

## 2024-12-07 LAB — MAGNESIUM: Magnesium: 2 mg/dL (ref 1.7–2.4)

## 2024-12-07 LAB — CBG MONITORING, ED
Glucose-Capillary: 185 mg/dL — ABNORMAL HIGH (ref 70–99)
Glucose-Capillary: 109 mg/dL — ABNORMAL HIGH (ref 70–99)

## 2024-12-07 LAB — LIPASE, BLOOD: Lipase: 14 U/L (ref 11–51)

## 2024-12-07 MED ORDER — OXYCODONE HCL 5 MG PO TABS: 5.0000 mg | ORAL_TABLET | ORAL | Status: DC | PRN

## 2024-12-07 MED ORDER — ONDANSETRON HCL 4 MG/2ML IJ SOLN
INTRAMUSCULAR | Status: AC
  Administered 2024-12-07: 4 mg
  Filled 2024-12-07: qty 2

## 2024-12-07 MED ORDER — LACTATED RINGERS IV BOLUS
1000.0000 mL | Freq: Once | INTRAVENOUS | Status: AC
  Administered 2024-12-07: 1000 mL via INTRAVENOUS

## 2024-12-07 MED ORDER — SODIUM CHLORIDE 0.9 % IV SOLN
2.0000 g | Freq: Once | INTRAVENOUS | Status: AC
  Administered 2024-12-07: 2 g via INTRAVENOUS
  Filled 2024-12-07: qty 20

## 2024-12-07 MED ORDER — INFLUENZA VAC SPLIT HIGH-DOSE 0.5 ML IM SUSY: 0.5000 mL | PREFILLED_SYRINGE | INTRAMUSCULAR | Status: DC

## 2024-12-07 MED ORDER — ACETAMINOPHEN 325 MG PO TABS
650.0000 mg | ORAL_TABLET | Freq: Four times a day (QID) | ORAL | Status: DC | PRN
  Administered 2024-12-07: 650 mg via ORAL
  Filled 2024-12-07: qty 2

## 2024-12-07 MED ORDER — ACETAMINOPHEN 650 MG RE SUPP: 650.0000 mg | Freq: Four times a day (QID) | RECTAL | Status: DC | PRN

## 2024-12-07 MED ORDER — POTASSIUM CHLORIDE 10 MEQ/100ML IV SOLN
10.0000 meq | INTRAVENOUS | Status: DC
  Filled 2024-12-07 (×2): qty 100

## 2024-12-07 MED ORDER — ENOXAPARIN SODIUM 40 MG/0.4ML IJ SOSY
40.0000 mg | PREFILLED_SYRINGE | INTRAMUSCULAR | Status: DC
  Administered 2024-12-07 – 2024-12-08 (×2): 40 mg via SUBCUTANEOUS
  Filled 2024-12-07 (×2): qty 0.4

## 2024-12-07 MED ORDER — INSULIN ASPART 100 UNIT/ML IJ SOLN: 0.0000 [IU] | Freq: Three times a day (TID) | INTRAMUSCULAR | Status: DC

## 2024-12-07 MED ORDER — POLYETHYLENE GLYCOL 3350 17 G PO PACK: 17.0000 g | PACK | Freq: Every day | ORAL | Status: DC | PRN

## 2024-12-07 MED ORDER — MORPHINE SULFATE (PF) 2 MG/ML IV SOLN: 2.0000 mg | INTRAVENOUS | Status: DC | PRN

## 2024-12-07 MED ORDER — SODIUM CHLORIDE 0.9 % IV SOLN
12.5000 mg | Freq: Once | INTRAVENOUS | Status: AC
  Administered 2024-12-07: 12.5 mg via INTRAVENOUS
  Filled 2024-12-07: qty 12.5

## 2024-12-07 MED ORDER — ONDANSETRON HCL 4 MG PO TABS: 4.0000 mg | ORAL_TABLET | Freq: Four times a day (QID) | ORAL | Status: DC | PRN

## 2024-12-07 MED ORDER — IOHEXOL 300 MG/ML  SOLN
100.0000 mL | Freq: Once | INTRAMUSCULAR | Status: AC | PRN
  Administered 2024-12-07: 100 mL via INTRAVENOUS

## 2024-12-07 MED ORDER — DEXTROSE IN LACTATED RINGERS 5 % IV SOLN: INTRAVENOUS | Status: DC

## 2024-12-07 MED ORDER — SODIUM CHLORIDE 0.9 % IV SOLN
2.0000 g | INTRAVENOUS | Status: DC
  Administered 2024-12-08: 2 g via INTRAVENOUS
  Filled 2024-12-07: qty 20

## 2024-12-07 MED ORDER — DEXTROSE 50 % IV SOLN: 0.0000 mL | INTRAVENOUS | Status: DC | PRN

## 2024-12-07 MED ORDER — INSULIN REGULAR(HUMAN) IN NACL 100-0.9 UT/100ML-% IV SOLN
INTRAVENOUS | Status: DC
  Filled 2024-12-07: qty 100

## 2024-12-07 MED ORDER — SODIUM CHLORIDE 0.9 % IV SOLN: INTRAVENOUS | Status: AC

## 2024-12-07 MED ORDER — LACTATED RINGERS IV SOLN: INTRAVENOUS | Status: DC

## 2024-12-07 MED ORDER — ONDANSETRON HCL 4 MG/2ML IJ SOLN: 4.0000 mg | Freq: Four times a day (QID) | INTRAMUSCULAR | Status: DC | PRN

## 2024-12-07 MED ORDER — ONDANSETRON HCL 4 MG/2ML IJ SOLN
4.0000 mg | Freq: Once | INTRAMUSCULAR | Status: AC | PRN
  Administered 2024-12-07: 4 mg via INTRAVENOUS
  Filled 2024-12-07: qty 2

## 2024-12-07 NOTE — ED Provider Notes (Signed)
 "  [facility] Provider Note    Event Date/Time   First MD Initiated Contact with Patient 12/07/24 0202     (approximate)   History   Chief Complaint Hematuria and Emesis   HPI  Haley Robles is a 23 y.o. female with past medical history of PCOS and anxiety who presents to the ED complaining of hematuria and emesis.  Patient reports that about 24 hours ago she first developed dysuria as well as some blood in her urine.  She describes the hematuria as a relatively small amount with blood and small clots mixed into her urine.  She has had associated chills but has not noticed any fevers or flank pain.  She has had increasing nausea, vomiting, diarrhea, cough, and congestion since then.  She describes some sharp discomfort in her chest when she goes to vomit as well as some mild difficulty breathing.  She has been unable to keep anything down today.     Physical Exam   Triage Vital Signs: ED Triage Vitals  Encounter Vitals Group     BP 12/07/24 0201 106/70     Girls Systolic BP Percentile --      Girls Diastolic BP Percentile --      Boys Systolic BP Percentile --      Boys Diastolic BP Percentile --      Pulse Rate 12/07/24 0201 (!) 44     Resp 12/07/24 0201 (!) 22     Temp 12/07/24 0201 (!) 97.5 F (36.4 C)     Temp Source 12/07/24 0201 Oral     SpO2 12/07/24 0201 100 %     Weight --      Height --      Head Circumference --      Peak Flow --      Pain Score 12/07/24 0200 10     Pain Loc --      Pain Education --      Exclude from Growth Chart --     Most recent vital signs: Vitals:   12/07/24 0500 12/07/24 0619  BP: 105/63 103/61  Pulse: (!) 51 (!) 54  Resp: 14 16  Temp:  (!) 97.4 F (36.3 C)  SpO2: 100% 100%    Constitutional: Alert and oriented.  Pale appearing. Eyes: Conjunctivae are normal. Head: Atraumatic. Nose: No congestion/rhinnorhea. Mouth/Throat: Mucous membranes are moist.  Cardiovascular: Bradycardic, regular rhythm.  Grossly normal heart sounds.  2+ radial pulses bilaterally. Respiratory: Normal respiratory effort.  No retractions. Lungs CTAB. Gastrointestinal: Soft and nontender. No distention. Musculoskeletal: No lower extremity tenderness nor edema.  Neurologic:  Normal speech and language. No gross focal neurologic deficits are appreciated.    ED Results / Procedures / Treatments   Labs (all labs ordered are listed, but only abnormal results are displayed) Labs Reviewed  URINALYSIS, ROUTINE W REFLEX MICROSCOPIC - Abnormal; Notable for the following components:      Result Value   Color, Urine YELLOW (*)    APPearance CLOUDY (*)    Glucose, UA >=500 (*)    Hgb urine dipstick SMALL (*)    Ketones, ur 80 (*)    Protein, ur 30 (*)    Bacteria, UA RARE (*)    All other components within normal limits  CBC WITH DIFFERENTIAL/PLATELET - Abnormal; Notable for the following components:   WBC 17.7 (*)    HCT 35.7 (*)    Neutro Abs 15.1 (*)    All other components within normal  limits  COMPREHENSIVE METABOLIC PANEL WITH GFR - Abnormal; Notable for the following components:   Potassium 3.4 (*)    CO2 15 (*)    Glucose, Bld 232 (*)    Total Protein 8.3 (*)    Anion gap 22 (*)    All other components within normal limits  BETA-HYDROXYBUTYRIC ACID - Abnormal; Notable for the following components:   Beta-Hydroxybutyric Acid 1.79 (*)    All other components within normal limits  BLOOD GAS, VENOUS - Abnormal; Notable for the following components:   pCO2, Ven 26 (*)    pO2, Ven 55 (*)    Bicarbonate 15.4 (*)    Acid-base deficit 8.0 (*)    All other components within normal limits  BASIC METABOLIC PANEL WITH GFR - Abnormal; Notable for the following components:   CO2 16 (*)    Glucose, Bld 188 (*)    Calcium 8.3 (*)    Anion gap 16 (*)    All other components within normal limits  CBG MONITORING, ED - Abnormal; Notable for the following components:   Glucose-Capillary 185 (*)    All other  components within normal limits  RESP PANEL BY RT-PCR (RSV, FLU A&B, COVID)  [id number]  URINE CULTURE  CULTURE, BLOOD (ROUTINE X 2)  CULTURE, BLOOD (ROUTINE X 2)  LIPASE, BLOOD  MAGNESIUM  HCG, QUANTITATIVE, PREGNANCY  LACTIC ACID, PLASMA  LACTIC ACID, PLASMA  POC URINE PREG, ED     EKG  ED ECG REPORT I, Carlin Palin, the attending physician, personally viewed and interpreted this ECG.   Date: 12/07/2024  EKG Time: 2:46  Rate: 47  Rhythm: sinus bradycardia  Axis: Normal  Intervals:none  ST&T Change: None  RADIOLOGY CT abdomen/pelvis reviewed and interpreted by me with inflammatory changes around the bladder, no ureteral stones noted.  PROCEDURES:  Critical Care performed: No  Procedures   MEDICATIONS ORDERED IN ED: Medications  promethazine  (PHENERGAN ) 12.5 mg in sodium chloride  0.9 % 50 mL IVPB (has no administration in time range)  cefTRIAXone  (ROCEPHIN ) 2 g in sodium chloride  0.9 % 100 mL IVPB (has no administration in time range)  ondansetron  (ZOFRAN ) injection 4 mg (4 mg Intravenous Given 12/07/24 0230)  ondansetron  (ZOFRAN ) 4 MG/2ML injection (4 mg  Given 12/07/24 0209)  lactated ringers  bolus 1,000 mL (0 mLs Intravenous Stopped 12/07/24 0417)  lactated ringers  bolus 1,000 mL (1,000 mLs Intravenous New Bag/Given 12/07/24 0512)  iohexol  (OMNIPAQUE ) 300 MG/ML solution 100 mL (100 mLs Intravenous Contrast Given 12/07/24 0617)     IMPRESSION / MDM / ASSESSMENT AND PLAN / ED COURSE  I reviewed the triage vital signs and the nursing notes.                              23 y.o. female with past medical history of PCOS and anxiety who presents to the ED with 24 hours of dysuria and hematuria, subsequently developed nausea, vomiting, diarrhea, chills, cough, and difficulty breathing.  Patient's presentation is most consistent with acute presentation with potential threat to life or bodily function.  Differential diagnosis includes, but is not limited to, UTI,  kidney stone, ectopic pregnancy, anemia, electrolyte abnormality, AKI, pneumonia, ACS, dehydration, electrolyte abnormality, AKI.  Patient ill-appearing but in no acute distress, vital signs are unremarkable.  She is actively dry heaving but has a benign abdominal exam, GI symptoms sound most consistent with a viral illness.  She does also have symptoms concerning for UTI,  but vital signs not concerning for sepsis.  Labs and urinalysis pending at this time, will also check EKG and chest x-ray.  Patient given IV Zofran  and IV fluids.  Labs with leukocytosis but no significant anemia or AKI.  Patient does have mild hyperglycemia as well as increased anion gap and acidosis.  She has no history of diabetes, but beta-hydroxybutyrate is elevated, concerning for DKA.  She was given additional IV fluids and initial plan was for IV insulin  drip, however VBG and repeat BMP were more reassuring, with improved anion gap.  CT imaging consistent with cystitis, urine was sent for culture and we will treat with IV Rocephin .  Patient continues to feel nauseous with intractable vomiting, will treat with IV Phenergan .  Case discussed with hospitalist for admission.      FINAL CLINICAL IMPRESSION(S) / ED DIAGNOSES   Final diagnoses:  Acute cystitis without hematuria  Intractable nausea and vomiting     Rx / DC Orders   ED Discharge Orders     None        Note:  This document was prepared using Dragon voice recognition software and may include unintentional dictation errors.   Willo Dunnings, MD 12/07/24 [phone]  "

## 2024-12-07 NOTE — Sepsis Progress Note (Signed)
 Elink will follow per sepsis protocol.

## 2024-12-07 NOTE — ED Notes (Signed)
 Magnesium added to previous labs per lab tech

## 2024-12-07 NOTE — H&P (Addendum)
 "  History and Physical    Haley Robles FMW:[id number] DOB: 2001-11-12 DOA: 12/07/2024  DOS: the patient was seen and examined on 12/07/2024  PCP: Pcp, No   Patient coming from: Home  I have personally briefly reviewed patient's old medical records in [facility]  Chief Complaint: Blood in the urine since Saturday night.  HPI: Haley Robles is a pleasant 23 y.o. female with medical history significant for PCOS, anxiety who came into ED complaining of hematuria and vomiting started Saturday night.  Patient stated that initially she felt that she had menstrual cycle but later she started having pain while urinating along with blood clots in the urine.  She also stated that she had some chills but no fever or flank pain.  She also had nausea and vomiting and also diarrhea along with cough and congestion.  She stated that her pain was around 5-6/10 in intensity, around suprapubic area and they were sharp but got better with the pain medications.  She had a similar chest discomfort while she vomited.  But that has resolved.  ED Course: Upon arrival to the ED, patient is found to be positive urine analysis, white count 17.7, potassium 3.4, beta hydroxybutyric acid was 1.79, bicarb was 16, pH was 7.3 and blood glucose was 185.  Patient was given IV fluid, started on sepsis protocol, antibiotics, blood and urine culture.  Lactate was 2.7.  Hospitalist service was consulted for evaluation for admission and possible DKA.  Review of Systems:  ROS  All other systems negative except as noted in the HPI.  Past Medical History:  Diagnosis Date   Abdominal pain, recurrent    Anxiety    Diarrhea    Vomiting     Past Surgical History:  Procedure Laterality Date   NO PAST SURGERIES       reports that she has never smoked. She has never used smokeless tobacco. She reports that she does not currently use alcohol. She reports current drug use. Drug: Marijuana.  Allergies[1]  Family History   Problem Relation Age of Onset   Diabetes Mother        prediabetic   Cancer Father 31       testicular   Pancreatitis Father    Cholelithiasis Father    Ulcers Father    Ulcers Maternal Grandmother    Cancer Maternal Grandfather        mouth   Diabetes Maternal Grandfather    Cholelithiasis Maternal Grandfather    Aneurysm Paternal Grandmother        brain   Cancer Paternal Grandfather        lung    Prior to Admission medications  Medication Sig Start Date End Date Taking? Authorizing Provider  drospirenone -ethinyl estradiol  (YAZ) 3-0.02 MG tablet Take 1 tablet by mouth daily. 04/08/24   Dominic, Jinnie Jansky, CNM  prochlorperazine  (COMPAZINE ) 10 MG tablet Take 1 tablet (10 mg total) by mouth every 6 (six) hours as needed for nausea or vomiting. 09/25/23   Floy Roberts, MD    Physical Exam: Vitals:   12/07/24 0237 12/07/24 0300 12/07/24 0500 12/07/24 0619  BP:  (!) 107/59 105/63 103/61  Pulse:  (!) 48 (!) 51 (!) 54  Resp:  11 14 16   Temp:    (!) 97.4 F (36.3 C)  TempSrc:    Oral  SpO2:  100% 100% 100%  Weight: 54.9 kg     Height: 5' 4 (1.626 m)  Physical Exam   Constitutional: Alert, awake, calm, comfortable HEENT: Neck supple Respiratory: Clear to auscultation B/L, no wheezing, no rales.  Cardiovascular: Regular rate and rhythm, no murmurs / rubs / gallops. No extremity edema. 2+ pedal pulses. No carotid bruits.  Abdomen: Soft, no tenderness, Bowel sounds positive.  Musculoskeletal: no clubbing / cyanosis. Good ROM, no contractures. Normal muscle tone.  Skin: no rashes, lesions, ulcers. Neurologic: CN 2-12 grossly intact. Sensation intact, No focal deficit identified Psychiatric: Alert and oriented x 3. Normal mood.    Labs on Admission: I have personally reviewed following labs and imaging studies  CBC: Recent Labs  Lab 12/07/24 0207  WBC 17.7*  NEUTROABS 15.1*  HGB 12.1  HCT 35.7*  MCV 89.0  PLT 184   Basic Metabolic Panel: Recent Labs   Lab 12/07/24 0207 12/07/24 0227 12/07/24 0548 12/07/24 0728  NA 136  --  135  --   K 3.4*  --  3.5  --   CL 98  --  103  --   CO2 15*  --  16*  --   GLUCOSE 232*  --  188*  --   BUN 13  --  12  --   CREATININE 0.88  --  0.66 0.63  CALCIUM 9.1  --  8.3*  --   MG  --  2.0  --   --    GFR: Estimated Creatinine Clearance: 92.8 mL/min (by C-G formula based on SCr of 0.63 mg/dL). Liver Function Tests: Recent Labs  Lab 12/07/24 0207  AST 26  ALT 16  ALKPHOS 72  BILITOT 0.9  PROT 8.3*  ALBUMIN 4.9   Recent Labs  Lab 12/07/24 0207  LIPASE 14   No results for input(s): AMMONIA  in the last 168 hours. Coagulation Profile: No results for input(s): INR, PROTIME in the last 168 hours. Cardiac Enzymes: No results for input(s): CKTOTAL, CKMB, CKMBINDEX, TROPONINI, TROPONINIHS in the last 168 hours. BNP (last 3 results) No results for input(s): BNP in the last 8760 hours. HbA1C: No results for input(s): HGBA1C in the last 72 hours. CBG: Recent Labs  Lab 12/07/24 0541  GLUCAP 185*   Lipid Profile: No results for input(s): CHOL, HDL, LDLCALC, TRIG, CHOLHDL, LDLDIRECT in the last 72 hours. Thyroid Function Tests: No results for input(s): TSH, T4TOTAL, FREET4, T3FREE, THYROIDAB in the last 72 hours. Anemia Panel: No results for input(s): VITAMINB12, FOLATE, FERRITIN, TIBC, IRON, RETICCTPCT in the last 72 hours. Urine analysis:    Component Value Date/Time   COLORURINE YELLOW (A) 12/07/2024 0600   APPEARANCEUR CLOUDY (A) 12/07/2024 0600   APPEARANCEUR Clear 06/25/2022 1518   LABSPEC 1.021 12/07/2024 0600   LABSPEC 1.034 04/01/2012 0526   PHURINE 6.0 12/07/2024 0600   GLUCOSEU >=500 (A) 12/07/2024 0600   GLUCOSEU Negative 04/01/2012 0526   HGBUR SMALL (A) 12/07/2024 0600   BILIRUBINUR NEGATIVE 12/07/2024 0600   BILIRUBINUR negative 01/15/2023 1636   BILIRUBINUR Negative 06/25/2022 1518   BILIRUBINUR Negative  04/01/2012 0526   KETONESUR 80 (A) 12/07/2024 0600   PROTEINUR 30 (A) 12/07/2024 0600   UROBILINOGEN 0.2 01/15/2023 1636   NITRITE NEGATIVE 12/07/2024 0600   LEUKOCYTESUR NEGATIVE 12/07/2024 0600   LEUKOCYTESUR Negative 04/01/2012 0526    Radiological Exams on Admission: I have personally reviewed images CT ABDOMEN PELVIS W CONTRAST Result Date: 12/07/2024 CLINICAL DATA:  Hematuria. EXAM: CT ABDOMEN AND PELVIS WITH CONTRAST TECHNIQUE: Multidetector CT imaging of the abdomen and pelvis was performed using the standard protocol following bolus administration of  intravenous contrast. RADIATION DOSE REDUCTION: This exam was performed according to the departmental dose-optimization program which includes automated exposure control, adjustment of the mA and/or kV according to patient size and/or use of iterative reconstruction technique. CONTRAST:  OMNIPAQUE  IOHEXOL  300 MG/ML  SOLN COMPARISON:  January 17, 2022 FINDINGS: Lower chest: No acute abnormality. Hepatobiliary: No focal liver abnormality is seen. No gallstones, gallbladder wall thickening, or biliary dilatation. Pancreas: Unremarkable. No pancreatic ductal dilatation or surrounding inflammatory changes. Spleen: Normal in size without focal abnormality. Adrenals/Urinary Tract: Adrenal glands are unremarkable. Kidneys are normal in size, without renal calculi or hydronephrosis. A stable subcentimeter simple cyst is seen within the left kidney. The urinary bladder is poorly distended and subsequently limited in evaluation. There is moderate severity diffuse urinary bladder wall thickening. A mild amount of surrounding inflammatory fat stranding is present. Stomach/Bowel: Stomach is within normal limits. Appendix appears normal. No evidence of bowel wall thickening, distention, or inflammatory changes. Vascular/Lymphatic: No significant vascular findings are present. No enlarged abdominal or pelvic lymph nodes. Reproductive: Uterus and bilateral  adnexa are unremarkable. Other: No abdominal wall hernia or abnormality. There is a very small amount of posterior pelvic free fluid on the right. Musculoskeletal: No acute or significant osseous findings. IMPRESSION: 1. Findings likely consistent with moderate severity acute cystitis. Correlation with urinalysis is recommended. 2. Very small amount of posterior pelvic free fluid on the right, likely physiologic. Electronically Signed   By: Suzen Dials M.D.   On: 12/07/2024 06:37   DG Chest Portable 1 View Result Date: 12/07/2024 EXAM: 1 VIEW XRAY OF THE CHEST 12/07/2024 02:42:00 AM COMPARISON: 2 / 1 / 23. CLINICAL HISTORY: SOB FINDINGS: LUNGS AND PLEURA: No focal pulmonary opacity. No pleural effusion. No pneumothorax. HEART AND MEDIASTINUM: No acute abnormality of the cardiac and mediastinal silhouettes. BONES AND SOFT TISSUES: No acute osseous abnormality. IMPRESSION: 1. No acute cardiopulmonary abnormality. Electronically signed by: Norman Gatlin MD 12/07/2024 02:48 AM EST RP Workstation: [id number]    EKG: My personal interpretation of EKG shows: Sinus rhythm at 47 bpm, no ST elevation    Assessment/Plan Principal Problem:   UTI (urinary tract infection) Active Problems:   Anxiety and depression   PCOS (polycystic ovarian syndrome)   Uncontrolled type 2 diabetes mellitus with hyperglycemia, without long-term current use of insulin  (HCC)    Assessment and Plan: 23 year old female with history of anxiety/depression, PCOS and marijuana use who came into ED complaining of nausea vomiting and abdominal pain with positive urinalysis and uncontrolled blood sugars.  1.  UTI not meeting criteria for sepsis - She will be placed in observation - She was started on IV ceftriaxone  - Will continue ceftriaxone  - CT scan of the abdomen and pelvis did not show obstruction or stones. - She will be on IV fluid, antinausea medications and Tylenol  for fever - Follow-up cultures  2.  Likely new  onset of diabetes with some component of acidosis - Her pH is almost normal - Maybe she has a high anion gap metabolic acidosis due to starvation ketosis. - Continue to monitor blood sugars - Continue IV fluid and insulin  sliding scale. - Will check A1c - No need for insulin  drip at this point - Continue to monitor BMP  3.  Anxiety/depression - Stable - Resume her home medications once her she is able to take by mouth     DVT prophylaxis: Lovenox  Code Status: Full Code Family Communication: Mother at bedside Disposition Plan: Home Consults called: None Admission status: Observation, Med-Surg  Nena Rebel, MD [facility] 12/07/2024, 9:26 AM        [1] No Known Allergies  "

## 2024-12-07 NOTE — ED Triage Notes (Addendum)
 Pt reports concern for bloody urine that started Saturday night. Has noticed blood clots. C/o generalized abdominal pain. New onset of vomiting, chills, fevers, and cough

## 2024-12-07 NOTE — ED Notes (Signed)
Patient to CT SCAN

## 2024-12-07 NOTE — Progress Notes (Signed)
 CODE SEPSIS - PHARMACY COMMUNICATION  **Broad Spectrum Antibiotics should be administered within 1 hour of Sepsis diagnosis**  Time Code Sepsis Called/Page Received: 06:43  Antibiotics Ordered: Ceftriaxone   Time of 1st antibiotic administration: 07:53  Additional action taken by pharmacy: N/A  If necessary, Name of Provider/Nurse Contacted: N/A    Ransom Blanch PGY-1 Pharmacy Resident  [place] [facility]  12/07/2024 10:01 AM

## 2024-12-08 ENCOUNTER — Other Ambulatory Visit: Payer: Self-pay

## 2024-12-08 DIAGNOSIS — N3001 Acute cystitis with hematuria: Secondary | ICD-10-CM | POA: Diagnosis not present

## 2024-12-08 LAB — CBC
HCT: 32.1 % — ABNORMAL LOW (ref 36.0–46.0)
Hemoglobin: 10.9 g/dL — ABNORMAL LOW (ref 12.0–15.0)
MCH: 30.2 pg (ref 26.0–34.0)
MCHC: 34 g/dL (ref 30.0–36.0)
MCV: 88.9 fL (ref 80.0–100.0)
Platelets: 140 K/uL — ABNORMAL LOW (ref 150–400)
RBC: 3.61 MIL/uL — ABNORMAL LOW (ref 3.87–5.11)
RDW: 12.9 % (ref 11.5–15.5)
WBC: 5.6 K/uL (ref 4.0–10.5)
nRBC: 0 % (ref 0.0–0.2)

## 2024-12-08 LAB — COMPREHENSIVE METABOLIC PANEL WITH GFR
ALT: 16 U/L (ref 0–44)
AST: 27 U/L (ref 15–41)
Albumin: 3.9 g/dL (ref 3.5–5.0)
Alkaline Phosphatase: 52 U/L (ref 38–126)
Anion gap: 10 (ref 5–15)
BUN: 6 mg/dL (ref 6–20)
CO2: 21 mmol/L — ABNORMAL LOW (ref 22–32)
Calcium: 8.4 mg/dL — ABNORMAL LOW (ref 8.9–10.3)
Chloride: 111 mmol/L (ref 98–111)
Creatinine, Ser: 0.68 mg/dL (ref 0.44–1.00)
GFR, Estimated: >60 mL/min
Glucose, Bld: 95 mg/dL (ref 70–99)
Potassium: 3.4 mmol/L — ABNORMAL LOW (ref 3.5–5.1)
Sodium: 142 mmol/L (ref 135–145)
Total Bilirubin: 0.3 mg/dL (ref 0.0–1.2)
Total Protein: 6.4 g/dL — ABNORMAL LOW (ref 6.5–8.1)

## 2024-12-08 LAB — HEMOGLOBIN A1C
Hgb A1c MFr Bld: 5.3 % (ref 4.8–5.6)
Mean Plasma Glucose: 105.41 mg/dL

## 2024-12-08 LAB — PROTIME-INR
INR: 1 (ref 0.8–1.2)
Prothrombin Time: 14.2 s (ref 11.4–15.2)

## 2024-12-08 LAB — GLUCOSE, CAPILLARY: Glucose-Capillary: 82 mg/dL (ref 70–99)

## 2024-12-08 LAB — C DIFFICILE QUICK SCREEN W PCR REFLEX
C Diff antigen: NEGATIVE
C Diff interpretation: NOT DETECTED
C Diff toxin: NEGATIVE

## 2024-12-08 MED ORDER — ACETAMINOPHEN 325 MG PO TABS
650.0000 mg | ORAL_TABLET | Freq: Four times a day (QID) | ORAL | 0 refills | Status: AC | PRN
  Filled 2024-12-08: qty 20, 3d supply, fill #0

## 2024-12-08 MED ORDER — POTASSIUM CHLORIDE CRYS ER 20 MEQ PO TBCR
40.0000 meq | EXTENDED_RELEASE_TABLET | Freq: Once | ORAL | Status: AC
  Administered 2024-12-08: 40 meq via ORAL
  Filled 2024-12-08: qty 2

## 2024-12-08 MED ORDER — CEPHALEXIN 500 MG PO CAPS
500.0000 mg | ORAL_CAPSULE | Freq: Two times a day (BID) | ORAL | 0 refills | Status: AC
  Filled 2024-12-08: qty 10, 5d supply, fill #0

## 2024-12-08 NOTE — Plan of Care (Signed)
" °  Problem: Education: Goal: Ability to describe self-care measures that may prevent or decrease complications (Diabetes Survival Skills Education) will improve Outcome: Progressing Goal: Individualized Educational Video(s) Outcome: Progressing   Problem: Coping: Goal: Ability to adjust to condition or change in health will improve Outcome: Progressing   Problem: Fluid Volume: Goal: Ability to maintain a balanced intake and output will improve Outcome: Progressing   Problem: Health Behavior/Discharge Planning: Goal: Ability to identify and utilize available resources and services will improve Outcome: Progressing Goal: Ability to manage health-related needs will improve Outcome: Progressing   Problem: Metabolic: Goal: Ability to maintain appropriate glucose levels will improve Outcome: Progressing   Problem: Nutritional: Goal: Maintenance of adequate nutrition will improve Outcome: Progressing Goal: Progress toward achieving an optimal weight will improve Outcome: Progressing   Problem: Skin Integrity: Goal: Risk for impaired skin integrity will decrease Outcome: Progressing   Problem: Tissue Perfusion: Goal: Adequacy of tissue perfusion will improve Outcome: Progressing   Problem: Health Behavior/Discharge Planning: Goal: Ability to manage health-related needs will improve Outcome: Progressing   Problem: Clinical Measurements: Goal: Ability to maintain clinical measurements within normal limits will improve Outcome: Progressing Goal: Will remain free from infection Outcome: Progressing Goal: Diagnostic test results will improve Outcome: Progressing Goal: Respiratory complications will improve Outcome: Progressing Goal: Cardiovascular complication will be avoided Outcome: Progressing   Problem: Activity: Goal: Risk for activity intolerance will decrease Outcome: Progressing   Problem: Nutrition: Goal: Adequate nutrition will be maintained Outcome:  Progressing   Problem: Elimination: Goal: Will not experience complications related to bowel motility Outcome: Progressing Goal: Will not experience complications related to urinary retention Outcome: Progressing   Problem: Pain Managment: Goal: General experience of comfort will improve and/or be controlled Outcome: Progressing   Problem: Safety: Goal: Ability to remain free from injury will improve Outcome: Progressing   "

## 2024-12-08 NOTE — Discharge Summary (Signed)
 " Physician Discharge Summary   Patient: Haley Robles MRN: [id number] DOB: July 09, 2001  Admit date:     12/07/2024  Discharge date: 12/08/2024  Discharge Physician: Drue ONEIDA Potter   PCP: Pcp, No   Recommendations at discharge:   Follow-up with PCP  Discharge Diagnoses: Principal Problem:   UTI (urinary tract infection) Active Problems:   Anxiety and depression   PCOS (polycystic ovarian syndrome)   Uncontrolled type 2 diabetes mellitus with hyperglycemia, without long-term current use of insulin  (HCC)  Resolved Problems:   * No resolved hospital problems. *  Hospital Course:  Haley Robles is a pleasant 23 y.o. female with medical history significant for PCOS, anxiety who came into ED complaining of hematuria and vomiting started Saturday night.  Patient stated that initially she felt that she had menstrual cycle but later she started having pain while urinating along with blood clots in the urine.  She also stated that she had some chills but no fever or flank pain.   ED Course: Upon arrival to the ED, patient is found to be positive urine analysis, white count 17.7, potassium 3.4, beta hydroxybutyric acid was 1.79, bicarb was 16, pH was 7.3 and blood glucose was 185.  Patient was given IV fluid, started on sepsis protocol, antibiotics, blood and urine culture.  Lactate was 2.7.  A1c came back at 5.3 ruling out any diabetes.  Patient received IV fluid as well as IV antibiotic therapy with improvement. Lab results of normalizing C. difficile test is negative. Her diarrhea is improving and patient has therefore been cleared for discharge today to follow-up as an outpatient with PCP    Consultants: None Procedures performed: None Disposition: Home Diet recommendation:  Cardiac diet DISCHARGE MEDICATION: Allergies as of 12/08/2024   No Known Allergies      Medication List     TAKE these medications    acetaminophen  325 MG tablet Commonly known as: TYLENOL  Take 2 tablets  (650 mg total) by mouth every 6 (six) hours as needed for mild pain (pain score 1-3) or fever (or Fever >/= 101).   cephALEXin  500 MG capsule Commonly known as: KEFLEX  Take 1 capsule (500 mg total) by mouth 2 (two) times daily for 5 days.   drospirenone -ethinyl estradiol  3-0.02 MG tablet Commonly known as: YAZ Take 1 tablet by mouth daily.   prochlorperazine  10 MG tablet Commonly known as: COMPAZINE  Take 1 tablet (10 mg total) by mouth every 6 (six) hours as needed for nausea or vomiting.        Discharge Exam: Filed Weights   12/07/24 0237  Weight: 54.9 kg   Constitutional: Alert, awake, calm, comfortable HEENT: Neck supple Respiratory: Clear to auscultation B/L, no wheezing, no rales.  Cardiovascular: Regular rate and rhythm, no murmurs  Abdomen: Soft, no tenderness, Bowel sounds positive.  Musculoskeletal: no clubbing / cyanosis.  Neurologic: CN 2-12 grossly intact. Sensation intact, No focal deficit identified Psychiatric: Alert and oriented x 3. Normal mood.    Condition at discharge: good  The results of significant diagnostics from this hospitalization (including imaging, microbiology, ancillary and laboratory) are listed below for reference.   Imaging Studies: CT ABDOMEN PELVIS W CONTRAST Result Date: 12/07/2024 CLINICAL DATA:  Hematuria. EXAM: CT ABDOMEN AND PELVIS WITH CONTRAST TECHNIQUE: Multidetector CT imaging of the abdomen and pelvis was performed using the standard protocol following bolus administration of intravenous contrast. RADIATION DOSE REDUCTION: This exam was performed according to the departmental dose-optimization program which includes automated exposure control, adjustment of  the mA and/or kV according to patient size and/or use of iterative reconstruction technique. CONTRAST:  OMNIPAQUE  IOHEXOL  300 MG/ML  SOLN COMPARISON:  January 17, 2022 FINDINGS: Lower chest: No acute abnormality. Hepatobiliary: No focal liver abnormality is seen. No  gallstones, gallbladder wall thickening, or biliary dilatation. Pancreas: Unremarkable. No pancreatic ductal dilatation or surrounding inflammatory changes. Spleen: Normal in size without focal abnormality. Adrenals/Urinary Tract: Adrenal glands are unremarkable. Kidneys are normal in size, without renal calculi or hydronephrosis. A stable subcentimeter simple cyst is seen within the left kidney. The urinary bladder is poorly distended and subsequently limited in evaluation. There is moderate severity diffuse urinary bladder wall thickening. A mild amount of surrounding inflammatory fat stranding is present. Stomach/Bowel: Stomach is within normal limits. Appendix appears normal. No evidence of bowel wall thickening, distention, or inflammatory changes. Vascular/Lymphatic: No significant vascular findings are present. No enlarged abdominal or pelvic lymph nodes. Reproductive: Uterus and bilateral adnexa are unremarkable. Other: No abdominal wall hernia or abnormality. There is a very small amount of posterior pelvic free fluid on the right. Musculoskeletal: No acute or significant osseous findings. IMPRESSION: 1. Findings likely consistent with moderate severity acute cystitis. Correlation with urinalysis is recommended. 2. Very small amount of posterior pelvic free fluid on the right, likely physiologic. Electronically Signed   By: Suzen Dials M.D.   On: 12/07/2024 06:37   DG Chest Portable 1 View Result Date: 12/07/2024 EXAM: 1 VIEW XRAY OF THE CHEST 12/07/2024 02:42:00 AM COMPARISON: 2 / 1 / 23. CLINICAL HISTORY: SOB FINDINGS: LUNGS AND PLEURA: No focal pulmonary opacity. No pleural effusion. No pneumothorax. HEART AND MEDIASTINUM: No acute abnormality of the cardiac and mediastinal silhouettes. BONES AND SOFT TISSUES: No acute osseous abnormality. IMPRESSION: 1. No acute cardiopulmonary abnormality. Electronically signed by: Norman Gatlin MD 12/07/2024 02:48 AM EST RP Workstation: [id number]     Microbiology: Results for orders placed or performed during the hospital encounter of 12/07/24  Resp panel by RT-PCR (RSV, Flu A&B, Covid) Anterior Nasal Swab     Status: None   Collection Time: 12/07/24  2:11 AM   Specimen: Anterior Nasal Swab  Result Value Ref Range Status   SARS Coronavirus 2 by RT PCR NEGATIVE NEGATIVE Final    Comment: (NOTE) SARS-CoV-2 target nucleic acids are NOT DETECTED.  The SARS-CoV-2 RNA is generally detectable in upper respiratory specimens during the acute phase of infection. The lowest concentration of SARS-CoV-2 viral copies this assay can detect is 138 copies/mL. A negative result does not preclude SARS-Cov-2 infection and should not be used as the sole basis for treatment or other patient management decisions. A negative result may occur with  improper specimen collection/handling, submission of specimen other than nasopharyngeal swab, presence of viral mutation(s) within the areas targeted by this assay, and inadequate number of viral copies(<138 copies/mL). A negative result must be combined with clinical observations, patient history, and epidemiological information. The expected result is Negative.  Fact Sheet for Patients:  bloggercourse.com  Fact Sheet for Healthcare Providers:  seriousbroker.it  This test is no t yet approved or cleared by the [place]  FDA and  has been authorized for detection and/or diagnosis of SARS-CoV-2 by FDA under an Emergency Use Authorization (EUA). This EUA will remain  in effect (meaning this test can be used) for the duration of the COVID-19 declaration under Section 564(b)(1) of the Act, 21 U.S.C.section 360bbb-3(b)(1), unless the authorization is terminated  or revoked sooner.       Influenza A by PCR  NEGATIVE NEGATIVE Final   Influenza B by PCR NEGATIVE NEGATIVE Final    Comment: (NOTE) The Xpert Xpress SARS-CoV-2/FLU/RSV plus assay is  intended as an aid in the diagnosis of influenza from Nasopharyngeal swab specimens and should not be used as a sole basis for treatment. Nasal washings and aspirates are unacceptable for Xpert Xpress SARS-CoV-2/FLU/RSV testing.  Fact Sheet for Patients: bloggercourse.com  Fact Sheet for Healthcare Providers: seriousbroker.it  This test is not yet approved or cleared by the [place]  FDA and has been authorized for detection and/or diagnosis of SARS-CoV-2 by FDA under an Emergency Use Authorization (EUA). This EUA will remain in effect (meaning this test can be used) for the duration of the COVID-19 declaration under Section 564(b)(1) of the Act, 21 U.S.C. section 360bbb-3(b)(1), unless the authorization is terminated or revoked.     Resp Syncytial Virus by PCR NEGATIVE NEGATIVE Final    Comment: (NOTE) Fact Sheet for Patients: bloggercourse.com  Fact Sheet for Healthcare Providers: seriousbroker.it  This test is not yet approved or cleared by the [place]  FDA and has been authorized for detection and/or diagnosis of SARS-CoV-2 by FDA under an Emergency Use Authorization (EUA). This EUA will remain in effect (meaning this test can be used) for the duration of the COVID-19 declaration under Section 564(b)(1) of the Act, 21 U.S.C. section 360bbb-3(b)(1), unless the authorization is terminated or revoked.  Performed at [facility], [address], [place], [place] [address]   Urine Culture     Status: Abnormal (Preliminary result)   Collection Time: 12/07/24  6:43 AM   Specimen: Urine, Clean Catch  Result Value Ref Range Status   Specimen Description   Final    URINE, CLEAN CATCH Performed at [facility], [address] [place], [place] [address]    Special Requests   Final    NONE Performed at [facility], [address],  [place], [place] [address]    Culture (A)  Final    20,000 COLONIES/mL STAPHYLOCOCCUS EPIDERMIDIS SUSCEPTIBILITIES TO FOLLOW Performed at [facility], [address] [place], [place] [address]    Report Status PENDING  Incomplete  Culture, blood (routine x 2)     Status: None (Preliminary result)   Collection Time: 12/07/24  7:42 AM   Specimen: BLOOD  Result Value Ref Range Status   Specimen Description BLOOD BLOOD RIGHT HAND  Final   Special Requests   Final    BOTTLES DRAWN AEROBIC AND ANAEROBIC Blood Culture adequate volume   Culture   Final    NO GROWTH < 24 HOURS Performed at [facility], [address] [place], [place] [address]    Report Status PENDING  Incomplete  Culture, blood (routine x 2)     Status: None (Preliminary result)   Collection Time: 12/07/24  7:42 AM   Specimen: BLOOD  Result Value Ref Range Status   Specimen Description BLOOD BLOOD LEFT ARM  Final   Special Requests   Final    BOTTLES DRAWN AEROBIC AND ANAEROBIC Blood Culture adequate volume   Culture   Final    NO GROWTH < 24 HOURS Performed at [facility], [address], [place], [place] [address]    Report Status PENDING  Incomplete  C Difficile Quick Screen w PCR reflex     Status: None   Collection Time: 12/07/24  9:17 AM   Specimen: STOOL  Result Value Ref Range Status   C Diff antigen NEGATIVE NEGATIVE  Final   C Diff toxin NEGATIVE NEGATIVE Final   C Diff interpretation No C. difficile detected.  Final    Comment: Performed at [facility], [address] [place], [place] [address]    Labs: CBC: Recent Labs  Lab 12/07/24 0207 12/08/24 0551  WBC 17.7* 5.6  NEUTROABS 15.1*  --   HGB 12.1 10.9*  HCT 35.7* 32.1*  MCV 89.0 88.9  PLT 184 140*   Basic Metabolic Panel: Recent Labs  Lab 12/07/24 0207 12/07/24 0227 12/07/24 0548 12/07/24 0728 12/08/24 0551  NA 136  --  135  --  142  K 3.4*  --  3.5  --  3.4*  CL 98  --  103  --  111  CO2 15*   --  16*  --  21*  GLUCOSE 232*  --  188*  --  95  BUN 13  --  12  --  6  CREATININE 0.88  --  0.66 0.63 0.68  CALCIUM 9.1  --  8.3*  --  8.4*  MG  --  2.0  --   --   --    Liver Function Tests: Recent Labs  Lab 12/07/24 0207 12/08/24 0551  AST 26 27  ALT 16 16  ALKPHOS 72 52  BILITOT 0.9 0.3  PROT 8.3* 6.4*  ALBUMIN 4.9 3.9   CBG: Recent Labs  Lab 12/07/24 0541 12/07/24 1155 12/07/24 1730 12/07/24 2035 12/08/24 0817  GLUCAP 185* 109* 80 122* 82    Discharge time spent:  37 minutes.  Signed: Drue ONEIDA Potter, MD [facility] 12/08/2024 "

## 2024-12-08 NOTE — TOC CM/SW Note (Signed)
 Transition of Care [facility]) - Inpatient Brief Assessment   Patient Details  Name: Haley Robles MRN: [id number] Date of Birth: September 27, 2001  Transition of Care [facility]) CM/SW Contact:    Daved JONETTA Hamilton, RN Phone Number: 12/08/2024, 9:08 AM   Clinical Narrative:   Transition of Care (TOC) Screening Note   Patient Details  Name: Haley Robles Date of Birth: 2001-04-13   Transition of Care [facility]) CM/SW Contact:    Daved JONETTA Hamilton, RN Phone Number: 12/08/2024, 9:08 AM   PCP and Food resources added to AVS  Transition of Care Department [facility]) has reviewed patient and no TOC needs have been identified at this time. If new patient transition needs arise, please place a TOC consult.    Transition of Care Asessment: Insurance and Status: Insurance coverage has been reviewed Patient has primary care physician: No (Resources have been added to AVS)   Prior level of function:: independent Prior/Current Home Services: No current home services Social Drivers of Health Review: SDOH reviewed interventions complete Readmission risk has been reviewed: No (Patient is in Observation status, no score generated) Transition of care needs: no transition of care needs at this time

## 2024-12-08 NOTE — Discharge Instructions (Signed)
 Food Resources  Agency Name: [facility] Address: [address] [place], [place] [address] Phone: [phone] Website: [email or web address] Service(s) Offered: Housing services, self-sufficiency, congregate meal program, weatherization program, event organiser program, emergency food assistance,  housing counseling, home ownership program, wheels - to work program.  [employer] free for 60 and older at various locations from usaa, Monday-Friday:  [employer], [address] [place], [phone] -[facility], [address], [place] [phone]  -[facility], [address] [place] [phone]  -[address], [place], [phone]  Agency Name: [facility] Address: [phone] [address], [place], [place] [address] Phone: [phone] Website: [email or web address] Service(s) Offered: Home delivered hot, frozen, and emergency  meals. Grocery assistance program which matches  volunteers one-on-one with seniors unable to grocery shop  for themselves. Must be 60 years and older; less than 20  hours of in-home aide service, limited or no driving ability;  live alone or with someone with a disability; live in  [place].  Agency Name: [employer] [facility] [employer]) Address: [address], [place], [place] [address] Phone: [phone] Service(s) Offered: Food is served to shut-ins, homeless, elderly, and low income people in the community every Saturday (11:30 am-12:30 pm) and Sunday (12:30 pm-1:30pm). Volunteers also offer help and encouragement in seeking employment,  and spiritual guidance.  Agency Name: [employer] Address: [address] [place], [place] [address] Phone: [phone] Service(s) Offered: Child support services; child welfare services; food stamps; Medicaid; work first family assistance; and aid  with fuel,  rent, food and medicine.  Agency Name: [employer] Address: [address] [place], [place] Phone: [phone] Website: [email or web address] Services Offered: Monday 10:00am-12:00, 8:00pm-9:00pm, and Friday 10:00am-12:00.  Agency Name: [employer] [facility] [employer] Address: [address] [place], [place] [address] Phone: [phone] Website: [email or web address] Service(s) Offered: Serves weekday meals, open from 11:30 am- 1:00 pm., and 6:30-7:30pm, Monday-Wednesday-Friday distributes food 3:30-6pm, Monday-Wednesday-Friday.  Agency Name: [employer] [facility] Address: [address] [place], [place] Phone: [phone] Website: [email or web address] Services Offered: Distributes food the 4th Saturday of the month, starting at 8:00 am  Agency Name: [facility] Address: [phone] [address] [place], [place] [address] Phone: [phone] Website: [email or web address] Service(s) Offered: [employer] life, weekly food pantry. Open Wednesdays from 10:00am-noon.  Agency Name: [facility] [employer] Address: [address] [place], [place], [place] Phone: [phone] Services Offered: Distributes food 9am-1pm, Monday-Thursday. Call for details.  Agency Name: [facility] Address: [address], [place], [place] [address] Phone: [phone] Website: [email or web address] Service(s) Offered: Games Developer. Call for assistance.  Agency Name: [facility] Address: [address] [place], [place] [address] Phone: [phone] Service Offered: Emergency Food Pantry. Call for appointment.  Agency Name: [facility] Address: [address] [place], [place] [address] Phone: [phone] Website: [email or web address] Services Offered: Games Developer. Call for details  Agency Name: [facility] Address: [address] [place], [place] Phone:  [phone] Website: [email or web address] Service(s) Offered: Emergency Food Pantry. Call for details.  Agency Name: [employer] Address: [address] [place], [place] [address] Phone: [phone] or [phone] Website: [email or web address] Service(s) Offered: Distribute food 9am-11:30 am, Tuesday-Friday, and 1-3:30pm, Monday-Friday. Food pantry Monday-Friday 1pm-3pm, fresh items, Mon.-Wed.-Fri.  Agency Name: [facility] [employer] [employer] Address: [address] [place], [place] [address] Phone: [phone] Website: [email or web address] Services Offered: Distribute food Tues and Sats from 9:00am-noon.  Closed 1st Saturday of each month. Call for details  Agency Name: [employer] Address: [facility] [address], [place] [address] Phone: [phone]  Services Offered: Delivers meals every Thursday   Some PCP options in [place] area- not a comprehensive list  [facility]- [phone] [facility]- [phone] [facility]- [phone] [facility]- [phone] [facility]- [phone] Nichole Molly- [phone]  or [facility] Physician Referral Line [phone]

## 2024-12-09 LAB — URINE CULTURE: Culture: 20000 — AB

## 2024-12-12 LAB — CULTURE, BLOOD (ROUTINE X 2)
Culture: NO GROWTH
Culture: NO GROWTH
Special Requests: ADEQUATE
Special Requests: ADEQUATE
